# Patient Record
Sex: Female | Born: 1950 | Race: White | Hispanic: No | Marital: Married | Smoking: Never smoker
Health system: Southern US, Community
[De-identification: ages and names within clinical notes are randomized; demographics above are authoritative.]

## PROBLEM LIST (undated history)

## (undated) DIAGNOSIS — Z9889 Other specified postprocedural states: Secondary | ICD-10-CM

## (undated) DIAGNOSIS — R112 Nausea with vomiting, unspecified: Secondary | ICD-10-CM

## (undated) DIAGNOSIS — R0600 Dyspnea, unspecified: Secondary | ICD-10-CM

## (undated) DIAGNOSIS — M1990 Unspecified osteoarthritis, unspecified site: Secondary | ICD-10-CM

## (undated) DIAGNOSIS — E785 Hyperlipidemia, unspecified: Secondary | ICD-10-CM

## (undated) DIAGNOSIS — K219 Gastro-esophageal reflux disease without esophagitis: Secondary | ICD-10-CM

## (undated) DIAGNOSIS — I10 Essential (primary) hypertension: Secondary | ICD-10-CM

## (undated) DIAGNOSIS — F411 Generalized anxiety disorder: Secondary | ICD-10-CM

## (undated) DIAGNOSIS — F32A Depression, unspecified: Secondary | ICD-10-CM

## (undated) HISTORY — PX: CHOLECYSTECTOMY: SHX55

---

## 2005-06-24 ENCOUNTER — Ambulatory Visit: Payer: Self-pay

## 2019-08-26 ENCOUNTER — Other Ambulatory Visit: Payer: Self-pay

## 2019-08-26 ENCOUNTER — Encounter: Payer: Self-pay | Admitting: Emergency Medicine

## 2019-08-26 ENCOUNTER — Ambulatory Visit
Admission: EM | Admit: 2019-08-26 | Discharge: 2019-08-26 | Disposition: A | Discharge status: Home / Self Care | Payer: Medicare PPO | Attending: Family Medicine | Admitting: Family Medicine

## 2019-08-26 DIAGNOSIS — J069 Acute upper respiratory infection, unspecified: Secondary | ICD-10-CM | POA: Diagnosis not present

## 2019-08-26 DIAGNOSIS — R05 Cough: Secondary | ICD-10-CM | POA: Insufficient documentation

## 2019-08-26 DIAGNOSIS — Z79899 Other long term (current) drug therapy: Secondary | ICD-10-CM | POA: Insufficient documentation

## 2019-08-26 DIAGNOSIS — Z20822 Contact with and (suspected) exposure to covid-19: Secondary | ICD-10-CM | POA: Insufficient documentation

## 2019-08-26 DIAGNOSIS — I10 Essential (primary) hypertension: Secondary | ICD-10-CM | POA: Diagnosis not present

## 2019-08-26 DIAGNOSIS — Z7901 Long term (current) use of anticoagulants: Secondary | ICD-10-CM | POA: Insufficient documentation

## 2019-08-26 HISTORY — DX: Gastro-esophageal reflux disease without esophagitis: K21.9

## 2019-08-26 HISTORY — DX: Essential (primary) hypertension: I10

## 2019-08-26 MED ORDER — HYDROCOD POLST-CPM POLST ER 10-8 MG/5ML PO SUER: 5.0000 mL | Freq: Every evening | ORAL | 0 refills | Status: DC | PRN

## 2019-08-26 MED ORDER — BENZONATATE 200 MG PO CAPS: 200.0000 mg | ORAL_CAPSULE | Freq: Three times a day (TID) | ORAL | 0 refills | Status: DC | PRN

## 2019-08-26 NOTE — ED Provider Notes (Signed)
[facility]    CSN: [id number] Arrival date & time: 08/26/19  0815      History   Chief Complaint Chief Complaint  Patient presents with  . Cough    HPI Kristen Riley is a 69 y.o. female.   69 yo female with a c/o cough, congestion, runny nose, body aches for the past 4 days (since Thursday). Denies any fevers, chest pains, shortness of breath. Positive sick contacts with grandsons with viral URIs. Patient has not had the covid vaccines.    Cough   Past Medical History:  Diagnosis Date  . GERD (gastroesophageal reflux disease)   . Hypertension     There are no problems to display for this patient.   Past Surgical History:  Procedure Laterality Date  . CHOLECYSTECTOMY      OB History   No obstetric history on file.      Home Medications    Prior to Admission medications   Medication Sig Start Date End Date Taking? Authorizing Provider  amLODipine (NORVASC) 2.5 MG tablet Take 2.5 mg by mouth daily. 08/25/19  Yes [provider]  Cholecalciferol 25 MCG (1000 UT) tablet Take by mouth. 03/10/19 03/09/20 Yes [provider]  pantoprazole (PROTONIX) 20 MG tablet ok 06/20/19  Yes [provider]  pravastatin (PRAVACHOL) 40 MG tablet Take 40 mg by mouth at bedtime. 06/20/19  Yes [provider]  venlafaxine XR (EFFEXOR-XR) 75 MG 24 hr capsule Take by mouth. 06/20/19  Yes [provider]  benzonatate (TESSALON) 200 MG capsule Take 1 capsule (200 mg total) by mouth 3 (three) times daily as needed. 08/26/19   Payton Mccallum, MD  chlorpheniramine-HYDROcodone (TUSSIONEX PENNKINETIC ER) 10-8 MG/5ML SUER Take 5 mLs by mouth at bedtime as needed. 08/26/19   Payton Mccallum, MD    Family History History reviewed. No pertinent family history.  Social History Social History   Tobacco Use  . Smoking status: Never Smoker  . Smokeless tobacco: Never Used  Vaping Use  . Vaping Use: Never used  Substance Use Topics  . Alcohol  use: Not Currently  . Drug use: Never     Allergies   Other   Review of Systems Review of Systems  Respiratory: Positive for cough.      Physical Exam Triage Vital Signs ED Triage Vitals  Enc Vitals Group     BP 08/26/19 0832 (!) 142/86     Pulse Rate 08/26/19 0832 (!) 103     Resp 08/26/19 0832 14     Temp 08/26/19 0832 98.3 F (36.8 C)     Temp Source 08/26/19 0832 Oral     SpO2 08/26/19 0832 100 %     Weight 08/26/19 0828 178 lb (80.7 kg)     Height 08/26/19 0828 5' (1.524 m)     Head Circumference --      Peak Flow --      Pain Score 08/26/19 0827 7     Pain Loc --      Pain Edu? --      Excl. in GC? --    No data found.  Updated Vital Signs BP (!) 142/86 (BP Location: Right Arm)   Pulse (!) 103   Temp 98.3 F (36.8 C) (Oral)   Resp 14   Ht 5' (1.524 m)   Wt 80.7 kg   SpO2 100%   BMI 34.76 kg/m   Visual Acuity Right Eye Distance:   Left Eye Distance:   Bilateral Distance:  Right Eye Near:   Left Eye Near:    Bilateral Near:     Physical Exam Vitals and nursing note reviewed.  Constitutional:      General: She is not in acute distress.    Appearance: She is not toxic-appearing or diaphoretic.  HENT:     Right Ear: Tympanic membrane normal.     Left Ear: Tympanic membrane normal.     Nose: Congestion and rhinorrhea present.     Mouth/Throat:     Pharynx: No oropharyngeal exudate or posterior oropharyngeal erythema.  Cardiovascular:     Rate and Rhythm: Normal rate.  Pulmonary:     Effort: Pulmonary effort is normal. No respiratory distress.     Breath sounds: Normal breath sounds.  Musculoskeletal:     Cervical back: Neck supple.  Neurological:     Mental Status: She is alert.      UC Treatments / Results  Labs (all labs ordered are listed, but only abnormal results are displayed) Labs Reviewed  SARS CORONAVIRUS 2 (TAT 6-24 HRS)    EKG   Radiology No results found.  Procedures Procedures (including critical care  time)  Medications Ordered in UC Medications - No data to display  Initial Impression / Assessment and Plan / UC Course  I have reviewed the triage vital signs and the nursing notes.  Pertinent labs & imaging results that were available during my care of the patient were reviewed by me and considered in my medical decision making (see chart for details).      Final Clinical Impressions(s) / UC Diagnoses   Final diagnoses:  Viral URI with cough    ED Prescriptions    Medication Sig Dispense Auth. Provider   benzonatate (TESSALON) 200 MG capsule Take 1 capsule (200 mg total) by mouth 3 (three) times daily as needed. 30 capsule Payton Mccallum, MD   chlorpheniramine-HYDROcodone (TUSSIONEX PENNKINETIC ER) 10-8 MG/5ML SUER Take 5 mLs by mouth at bedtime as needed. 60 mL Payton Mccallum, MD      1. diagnosis reviewed with patient 2. rx as per orders above; reviewed possible side effects, interactions, risks and benefits  3. Recommend supportive treatment with rest, fluids 4. Follow-up prn if symptoms worsen or don't improve  I have reviewed the PDMP during this encounter.   Payton Mccallum, MD 08/26/19 [phone]

## 2019-08-26 NOTE — ED Triage Notes (Signed)
Patient c/o cough, chest congestion, bodyaches, and HAs that started on Thursday. Patient denies fevers.

## 2019-08-27 LAB — SARS CORONAVIRUS 2 (TAT 6-24 HRS): SARS Coronavirus 2: NEGATIVE

## 2020-10-26 ENCOUNTER — Ambulatory Visit
Admission: EM | Admit: 2020-10-26 | Discharge: 2020-10-26 | Disposition: A | Discharge status: Home / Self Care | Payer: Medicare PPO | Attending: Physician Assistant | Admitting: Physician Assistant

## 2020-10-26 ENCOUNTER — Encounter: Payer: Self-pay | Admitting: Emergency Medicine

## 2020-10-26 ENCOUNTER — Other Ambulatory Visit: Payer: Self-pay

## 2020-10-26 DIAGNOSIS — U071 COVID-19: Secondary | ICD-10-CM | POA: Diagnosis present

## 2020-10-26 DIAGNOSIS — R5383 Other fatigue: Secondary | ICD-10-CM | POA: Diagnosis present

## 2020-10-26 DIAGNOSIS — J029 Acute pharyngitis, unspecified: Secondary | ICD-10-CM | POA: Diagnosis present

## 2020-10-26 LAB — POCT RAPID STREP A: Streptococcus, Group A Screen (Direct): NEGATIVE

## 2020-10-26 MED ORDER — LIDOCAINE VISCOUS HCL 2 % MT SOLN: 15.0000 mL | OROMUCOSAL | 0 refills | Status: AC | PRN

## 2020-10-26 NOTE — ED Triage Notes (Signed)
Pt states she tested her self at home about 5 days ago and was positive for covid. She states she is feeling better. She still has some body aches and a sore throat. Denies fever.

## 2020-10-26 NOTE — Discharge Instructions (Addendum)
-  The strep test is negative.  Your sore throat is due to COVID-19 infection.  It should resolve in the next 3 to 5 days.  Purchase over-the-counter Chloraseptic spray and use throat lozenges as well as salt water gargles.  I have also sent in viscous lidocaine. -You need to isolate the rest of today and starting tomorrow you need to wear a mask for 5 more days according to CDC guidelines. -Increase rest and fluids. -If you develop a fever, severe cough, chest pain, breathing difficulty, weakness you should be seen again immediately.  For any severe acute worsening symptoms please go to emergency department.

## 2020-10-26 NOTE — ED Provider Notes (Signed)
[facility]    CSN: [id number] Arrival date & time: 10/26/20  0816      History   Chief Complaint Chief Complaint  Patient presents with   Covid Positive    HPI Kristen Riley is a 70 y.o. female presenting for 5-day history of nasal congestion, fatigue, aches and sore throat.  Patient did have a positive at home COVID test 5 days ago.  Her symptoms started the night before.  She has not received any COVID-19 vaccines.  Patient has been taking Tylenol and ibuprofen for body aches.  She says she feels on average 70% better than she did at the start of her symptoms.  She is just concerned about the possibility of strep throat since her throat is still sore.  All other symptoms have improved and nearly resolved.  She denies any breathing difficulty, weakness, chest pain, abdominal pain, vomiting or diarrhea.  She has a history of hypertension and is otherwise healthy.  She has no other complaints.  HPI  Past Medical History:  Diagnosis Date   GERD (gastroesophageal reflux disease)    Hypertension     There are no problems to display for this patient.   Past Surgical History:  Procedure Laterality Date   CHOLECYSTECTOMY      OB History   No obstetric history on file.      Home Medications    Prior to Admission medications   Medication Sig Start Date End Date Taking? Authorizing Provider  amLODipine (NORVASC) 2.5 MG tablet Take 2.5 mg by mouth daily. 08/25/19  Yes [provider]  lidocaine (XYLOCAINE) 2 % solution Use as directed 15 mLs in the mouth or throat every 3 (three) hours as needed for mouth pain (swish and spit). 10/26/20  Yes Eusebio Friendly B, PA-C  pantoprazole (PROTONIX) 20 MG tablet ok 06/20/19  Yes [provider]  venlafaxine XR (EFFEXOR-XR) 75 MG 24 hr capsule Take by mouth. 06/20/19  Yes [provider]  pravastatin (PRAVACHOL) 40 MG tablet Take 40 mg by mouth at bedtime. 06/20/19   [provider]    Family  History History reviewed. No pertinent family history.  Social History Social History   Tobacco Use   Smoking status: Never   Smokeless tobacco: Never  Vaping Use   Vaping Use: Never used  Substance Use Topics   Alcohol use: Not Currently   Drug use: Never     Allergies   Other   Review of Systems Review of Systems  Constitutional:  Positive for fatigue. Negative for chills, diaphoresis and fever.  HENT:  Positive for congestion, rhinorrhea and sore throat. Negative for ear pain, sinus pressure and sinus pain.   Respiratory:  Negative for cough and shortness of breath.   Gastrointestinal:  Negative for abdominal pain, nausea and vomiting.  Musculoskeletal:  Positive for myalgias. Negative for arthralgias.  Skin:  Negative for rash.  Neurological:  Negative for weakness and headaches.  Hematological:  Negative for adenopathy.    Physical Exam Triage Vital Signs ED Triage Vitals  Enc Vitals Group     BP 10/26/20 0838 140/81     Pulse Rate 10/26/20 0838 86     Resp 10/26/20 0838 18     Temp 10/26/20 0838 98 F (36.7 C)     Temp Source 10/26/20 0838 Oral     SpO2 10/26/20 0838 98 %     Weight 10/26/20 0836 177 lb 14.6 oz (80.7 kg)     Height 10/26/20  [phone] 5' (1.524 m)     Head Circumference --      Peak Flow --      Pain Score 10/26/20 0836 6     Pain Loc --      Pain Edu? --      Excl. in GC? --    No data found.  Updated Vital Signs BP 140/81 (BP Location: Left Arm)   Pulse 86   Temp 98 F (36.7 C) (Oral)   Resp 18   Ht 5' (1.524 m)   Wt 177 lb 14.6 oz (80.7 kg)   SpO2 98%   BMI 34.75 kg/m      Physical Exam Vitals and nursing note reviewed.  Constitutional:      General: She is not in acute distress.    Appearance: Normal appearance. She is not ill-appearing or toxic-appearing.  HENT:     Head: Normocephalic and atraumatic.     Nose: Congestion present.     Mouth/Throat:     Mouth: Mucous membranes are moist.     Pharynx: Oropharynx is  clear. Posterior oropharyngeal erythema present.  Eyes:     General: No scleral icterus.       Right eye: No discharge.        Left eye: No discharge.     Conjunctiva/sclera: Conjunctivae normal.  Cardiovascular:     Rate and Rhythm: Normal rate and regular rhythm.     Heart sounds: Normal heart sounds.  Pulmonary:     Effort: Pulmonary effort is normal. No respiratory distress.     Breath sounds: Normal breath sounds.  Musculoskeletal:     Cervical back: Neck supple.  Skin:    General: Skin is dry.  Neurological:     General: No focal deficit present.     Mental Status: She is alert. Mental status is at baseline.     Motor: No weakness.     Gait: Gait normal.  Psychiatric:        Mood and Affect: Mood normal.        Behavior: Behavior normal.        Thought Content: Thought content normal.     UC Treatments / Results  Labs (all labs ordered are listed, but only abnormal results are displayed) Labs Reviewed  CULTURE, GROUP A STREP [facility])  POCT RAPID STREP A    EKG   Radiology No results found.  Procedures Procedures (including critical care time)  Medications Ordered in UC Medications - No data to display  Initial Impression / Assessment and Plan / UC Course  I have reviewed the triage vital signs and the nursing notes.  Pertinent labs & imaging results that were available during my care of the patient were reviewed by me and considered in my medical decision making (see chart for details).  70 year old female presenting for 5-day history of sore throat, congestion, fatigue and achiness.  Patient did test positive for COVID-19 with an at home test 5 days ago and yesterday.  Patient reportedly feeling on average 70% better from onset of symptoms.  Vitals are all normal and stable and she is overall well-appearing.  On exam she has minor nasal congestion and posterior pharyngeal erythema and injection.  Chest is clear to auscultation heart regular rate and  rhythm.  Rapid strep test negative.  Culture sent.  Reviewed current CDC guidelines, isolation protocol and ED precautions for COVID-19.  Since she had 2 positive tests she does not need to be tested again.  She is on the fifth day of symptoms and has greatly improved.  I did discuss antiviral therapy with her versus continuing to treat symptoms at home.  Since she is doing so much better she has decided to hold off on any antiviral medications and monitor symptoms and follow-up for any worsening of symptoms.  I have sent viscous lidocaine for throat and reviewed supportive care techniques.  Final Clinical Impressions(s) / UC Diagnoses   Final diagnoses:  COVID-19  Sore throat  Fatigue, unspecified type     Discharge Instructions      -The strep test is negative.  Your sore throat is due to COVID-19 infection.  It should resolve in the next 3 to 5 days.  Purchase over-the-counter Chloraseptic spray and use throat lozenges as well as salt water gargles.  I have also sent in viscous lidocaine. -You need to isolate the rest of today and starting tomorrow you need to wear a mask for 5 more days according to CDC guidelines. -Increase rest and fluids. -If you develop a fever, severe cough, chest pain, breathing difficulty, weakness you should be seen again immediately.  For any severe acute worsening symptoms please go to emergency department.     ED Prescriptions     Medication Sig Dispense Auth. Provider   lidocaine (XYLOCAINE) 2 % solution Use as directed 15 mLs in the mouth or throat every 3 (three) hours as needed for mouth pain (swish and spit). 100 mL Shirlee Latch, PA-C      PDMP not reviewed this encounter.   Shirlee Latch, PA-C 10/26/20 [phone]

## 2020-10-29 LAB — CULTURE, GROUP A STREP (THRC)

## 2021-02-23 ENCOUNTER — Emergency Department: Payer: Medicare PPO

## 2021-02-23 ENCOUNTER — Other Ambulatory Visit: Payer: Self-pay

## 2021-02-23 ENCOUNTER — Encounter: Payer: Self-pay | Admitting: Emergency Medicine

## 2021-02-23 ENCOUNTER — Observation Stay
Admission: EM | Admit: 2021-02-23 | Discharge: 2021-02-24 | Disposition: A | Discharge status: Home / Self Care | Payer: Medicare PPO | Attending: Internal Medicine | Admitting: Internal Medicine

## 2021-02-23 DIAGNOSIS — I10 Essential (primary) hypertension: Secondary | ICD-10-CM | POA: Insufficient documentation

## 2021-02-23 DIAGNOSIS — K449 Diaphragmatic hernia without obstruction or gangrene: Secondary | ICD-10-CM | POA: Insufficient documentation

## 2021-02-23 DIAGNOSIS — E278 Other specified disorders of adrenal gland: Secondary | ICD-10-CM

## 2021-02-23 DIAGNOSIS — K219 Gastro-esophageal reflux disease without esophagitis: Secondary | ICD-10-CM | POA: Diagnosis present

## 2021-02-23 DIAGNOSIS — I700 Atherosclerosis of aorta: Secondary | ICD-10-CM | POA: Diagnosis not present

## 2021-02-23 DIAGNOSIS — Z79899 Other long term (current) drug therapy: Secondary | ICD-10-CM | POA: Diagnosis not present

## 2021-02-23 DIAGNOSIS — B950 Streptococcus, group A, as the cause of diseases classified elsewhere: Secondary | ICD-10-CM

## 2021-02-23 DIAGNOSIS — A419 Sepsis, unspecified organism: Principal | ICD-10-CM | POA: Insufficient documentation

## 2021-02-23 DIAGNOSIS — J209 Acute bronchitis, unspecified: Secondary | ICD-10-CM | POA: Insufficient documentation

## 2021-02-23 DIAGNOSIS — R531 Weakness: Secondary | ICD-10-CM | POA: Diagnosis present

## 2021-02-23 DIAGNOSIS — R6510 Systemic inflammatory response syndrome (SIRS) of non-infectious origin without acute organ dysfunction: Secondary | ICD-10-CM | POA: Diagnosis not present

## 2021-02-23 DIAGNOSIS — J020 Streptococcal pharyngitis: Secondary | ICD-10-CM | POA: Diagnosis not present

## 2021-02-23 DIAGNOSIS — E785 Hyperlipidemia, unspecified: Secondary | ICD-10-CM

## 2021-02-23 DIAGNOSIS — Z20822 Contact with and (suspected) exposure to covid-19: Secondary | ICD-10-CM | POA: Diagnosis not present

## 2021-02-23 HISTORY — DX: Hyperlipidemia, unspecified: E78.5

## 2021-02-23 LAB — COMPREHENSIVE METABOLIC PANEL
ALT: 28 U/L (ref 0–44)
AST: 26 U/L (ref 15–41)
Albumin: 3.7 g/dL (ref 3.5–5.0)
Alkaline Phosphatase: 68 U/L (ref 38–126)
Anion gap: 10 (ref 5–15)
BUN: 8 mg/dL (ref 8–23)
CO2: 23 mmol/L (ref 22–32)
Calcium: 9 mg/dL (ref 8.9–10.3)
Chloride: 103 mmol/L (ref 98–111)
Creatinine, Ser: 0.64 mg/dL (ref 0.44–1.00)
GFR, Estimated: >60 mL/min (ref >60)
Glucose, Bld: 165 mg/dL — ABNORMAL HIGH (ref 70–99)
Potassium: 3.7 mmol/L (ref 3.5–5.1)
Sodium: 136 mmol/L (ref 135–145)
Total Bilirubin: 0.6 mg/dL (ref 0.3–1.2)
Total Protein: 7.1 g/dL (ref 6.5–8.1)

## 2021-02-23 LAB — TROPONIN I (HIGH SENSITIVITY)
Troponin I (High Sensitivity): 11 ng/L (ref <18)
Troponin I (High Sensitivity): 7 ng/L (ref <18)

## 2021-02-23 LAB — RESPIRATORY PANEL BY PCR

## 2021-02-23 LAB — APTT: aPTT: 31 seconds (ref 24–36)

## 2021-02-23 LAB — URINALYSIS, COMPLETE (UACMP) WITH MICROSCOPIC
Bacteria, UA: NONE SEEN
Bilirubin Urine: NEGATIVE
Glucose, UA: NEGATIVE mg/dL
Hgb urine dipstick: NEGATIVE
Ketones, ur: NEGATIVE mg/dL
Leukocytes,Ua: NEGATIVE
Nitrite: NEGATIVE
Protein, ur: NEGATIVE mg/dL
Specific Gravity, Urine: >1.03 — ABNORMAL HIGH (ref 1.005–1.030)
pH: 5 (ref 5.0–8.0)

## 2021-02-23 LAB — GROUP A STREP BY PCR: Group A Strep by PCR: DETECTED — AB

## 2021-02-23 LAB — CBC WITH DIFFERENTIAL/PLATELET
Abs Immature Granulocytes: 0.13 10*3/uL — ABNORMAL HIGH (ref 0.00–0.07)
Basophils Absolute: 0.1 10*3/uL (ref 0.0–0.1)
Basophils Relative: 0 %
Eosinophils Absolute: 0 10*3/uL (ref 0.0–0.5)
Eosinophils Relative: 0 %
HCT: 41.9 % (ref 36.0–46.0)
Hemoglobin: 13.8 g/dL (ref 12.0–15.0)
Immature Granulocytes: 1 %
Lymphocytes Relative: 8 %
Lymphs Abs: 1.5 10*3/uL (ref 0.7–4.0)
MCH: 30.4 pg (ref 26.0–34.0)
MCHC: 32.9 g/dL (ref 30.0–36.0)
MCV: 92.3 fL (ref 80.0–100.0)
Monocytes Absolute: 0.9 10*3/uL (ref 0.1–1.0)
Monocytes Relative: 5 %
Neutro Abs: 15.3 10*3/uL — ABNORMAL HIGH (ref 1.7–7.7)
Neutrophils Relative %: 86 %
Platelets: 374 10*3/uL (ref 150–400)
RBC: 4.54 MIL/uL (ref 3.87–5.11)
RDW: 12.2 % (ref 11.5–15.5)
WBC: 17.8 10*3/uL — ABNORMAL HIGH (ref 4.0–10.5)
nRBC: 0 % (ref 0.0–0.2)

## 2021-02-23 LAB — PROTIME-INR
INR: 1 (ref 0.8–1.2)
Prothrombin Time: 13.2 seconds (ref 11.4–15.2)

## 2021-02-23 LAB — RESP PANEL BY RT-PCR (FLU A&B, COVID) ARPGX2
Influenza A by PCR: NEGATIVE
Influenza B by PCR: NEGATIVE
SARS Coronavirus 2 by RT PCR: NEGATIVE

## 2021-02-23 LAB — TSH: TSH: 0.896 u[IU]/mL (ref 0.350–4.500)

## 2021-02-23 LAB — STREP PNEUMONIAE URINARY ANTIGEN: Strep Pneumo Urinary Antigen: NEGATIVE

## 2021-02-23 LAB — PROCALCITONIN: Procalcitonin: <0.1 ng/mL

## 2021-02-23 LAB — LACTIC ACID, PLASMA
Lactic Acid, Venous: 1.5 mmol/L (ref 0.5–1.9)
Lactic Acid, Venous: 1.1 mmol/L (ref 0.5–1.9)

## 2021-02-23 MED ORDER — PANTOPRAZOLE SODIUM 20 MG PO TBEC
20.0000 mg | DELAYED_RELEASE_TABLET | Freq: Every day | ORAL | Status: DC
  Administered 2021-02-23 – 2021-02-24 (×2): 20 mg via ORAL
  Filled 2021-02-23 (×2): qty 1

## 2021-02-23 MED ORDER — LACTATED RINGERS IV SOLN: INTRAVENOUS | Status: DC

## 2021-02-23 MED ORDER — SODIUM CHLORIDE 0.9 % IV SOLN
2.0000 g | Freq: Once | INTRAVENOUS | Status: AC
  Administered 2021-02-23: 2 g via INTRAVENOUS
  Filled 2021-02-23: qty 2

## 2021-02-23 MED ORDER — METRONIDAZOLE 500 MG/100ML IV SOLN
500.0000 mg | Freq: Once | INTRAVENOUS | Status: AC
  Administered 2021-02-23: 500 mg via INTRAVENOUS
  Filled 2021-02-23: qty 100

## 2021-02-23 MED ORDER — PRAVASTATIN SODIUM 20 MG PO TABS
40.0000 mg | ORAL_TABLET | Freq: Every day | ORAL | Status: DC
  Administered 2021-02-23: 40 mg via ORAL
  Filled 2021-02-23: qty 2

## 2021-02-23 MED ORDER — IOHEXOL 350 MG/ML SOLN
100.0000 mL | Freq: Once | INTRAVENOUS | Status: AC | PRN
  Administered 2021-02-23: 100 mL via INTRAVENOUS

## 2021-02-23 MED ORDER — ONDANSETRON HCL 4 MG/2ML IJ SOLN: 4.0000 mg | Freq: Three times a day (TID) | INTRAMUSCULAR | Status: DC | PRN

## 2021-02-23 MED ORDER — LACTATED RINGERS IV BOLUS
1000.0000 mL | Freq: Once | INTRAVENOUS | Status: AC
  Administered 2021-02-23: 1000 mL via INTRAVENOUS

## 2021-02-23 MED ORDER — ACETAMINOPHEN 325 MG PO TABS
650.0000 mg | ORAL_TABLET | Freq: Four times a day (QID) | ORAL | Status: DC | PRN
  Administered 2021-02-23 – 2021-02-24 (×3): 650 mg via ORAL
  Filled 2021-02-23 (×3): qty 2

## 2021-02-23 MED ORDER — DM-GUAIFENESIN ER 30-600 MG PO TB12
1.0000 | ORAL_TABLET | Freq: Two times a day (BID) | ORAL | Status: DC | PRN
  Filled 2021-02-23: qty 1

## 2021-02-23 MED ORDER — ALBUTEROL SULFATE (2.5 MG/3ML) 0.083% IN NEBU: 2.5000 mg | INHALATION_SOLUTION | RESPIRATORY_TRACT | Status: DC | PRN

## 2021-02-23 MED ORDER — SODIUM CHLORIDE 0.9 % IV SOLN
1.0000 g | INTRAVENOUS | Status: DC
  Filled 2021-02-23: qty 10

## 2021-02-23 MED ORDER — SODIUM CHLORIDE 0.9 % IV SOLN: 1.0000 g | INTRAVENOUS | Status: DC

## 2021-02-23 MED ORDER — SODIUM CHLORIDE 0.9 % IV SOLN
500.0000 mg | INTRAVENOUS | Status: DC
  Administered 2021-02-23: 500 mg via INTRAVENOUS
  Filled 2021-02-23: qty 5

## 2021-02-23 MED ORDER — ENOXAPARIN SODIUM 40 MG/0.4ML IJ SOSY
40.0000 mg | PREFILLED_SYRINGE | INTRAMUSCULAR | Status: DC
  Administered 2021-02-23: 40 mg via SUBCUTANEOUS
  Filled 2021-02-23: qty 0.4

## 2021-02-23 MED ORDER — VENLAFAXINE HCL ER 75 MG PO CP24
75.0000 mg | ORAL_CAPSULE | Freq: Every day | ORAL | Status: DC
  Filled 2021-02-23: qty 1

## 2021-02-23 MED ORDER — ONDANSETRON HCL 4 MG/2ML IJ SOLN
4.0000 mg | Freq: Once | INTRAMUSCULAR | Status: AC
  Administered 2021-02-23: 4 mg via INTRAVENOUS
  Filled 2021-02-23: qty 2

## 2021-02-23 MED ORDER — ACETAMINOPHEN 500 MG PO TABS
1000.0000 mg | ORAL_TABLET | Freq: Once | ORAL | Status: AC
  Administered 2021-02-23: 1000 mg via ORAL
  Filled 2021-02-23: qty 2

## 2021-02-23 MED ORDER — VANCOMYCIN HCL IN DEXTROSE 1-5 GM/200ML-% IV SOLN
1000.0000 mg | Freq: Once | INTRAVENOUS | Status: DC
  Filled 2021-02-23: qty 200

## 2021-02-23 MED ORDER — PHENOL 1.4 % MT LIQD
1.0000 | OROMUCOSAL | Status: DC | PRN
  Filled 2021-02-23: qty 177

## 2021-02-23 MED ORDER — HYDRALAZINE HCL 20 MG/ML IJ SOLN: 5.0000 mg | INTRAMUSCULAR | Status: DC | PRN

## 2021-02-23 MED ORDER — DICLOFENAC SODIUM 75 MG PO TBEC
75.0000 mg | DELAYED_RELEASE_TABLET | Freq: Every day | ORAL | Status: DC
  Administered 2021-02-23 – 2021-02-24 (×2): 75 mg via ORAL
  Filled 2021-02-23 (×2): qty 1

## 2021-02-23 NOTE — ED Notes (Signed)
First nurse note: pt from home BIB EMS for generalized weakness. Pt's BP was originally 70/40s, last one with EMS was 90/60s. Denies any sick contacts.

## 2021-02-23 NOTE — Progress Notes (Signed)
CODE SEPSIS - PHARMACY COMMUNICATION  **Broad Spectrum Antibiotics should be administered within 1 hour of Sepsis diagnosis**  Time Code Sepsis Called/Page Received: 1751  Antibiotics Ordered: cefepime/metronidazole/vancomycin  Time of 1st antibiotic administration: 0735   Pricilla Riffle, PharmD, BCPS Clinical Pharmacist 02/23/2021 8:22 AM

## 2021-02-23 NOTE — ED Notes (Signed)
Unhooked patient to go to the restroom. Patient has been hooked back up and is resting in bed.

## 2021-02-23 NOTE — H&P (Signed)
History and Physical    Kristen Riley WHQ:[id number] DOB: [birth year] DOA: 02/23/2021  Referring MD/NP/PA:   PCP: Butler Denmark, MD   Patient coming from:  The patient is coming from home.    Chief Complaint: Fever, chills, cough, sore throat, nausea, vomiting, body aches, weakness  HPI: Kristen Riley is a 71 y.o. female with medical history significant of hypertension, hyperlipidemia, GERD, who presents with fever, chills, cough, sore throat, nausea, vomiting, body aches, weakness.  Patient states that he has been sick and not feeling well since yesterday.  She has fever, chills, body aches, sore throat, dry cough, generalized weakness.  Patient also has nausea and several episodes of nonbilious nonbloody vomiting.  No abdominal pain or diarrhea.  Denies shortness breath or chest pain.  No symptoms of UTI.  ED Course: pt was found to have positive rapid Strep A, negative COVID PCR, negative RVP, WBC 17.8, lactic acid 1.5, INR 1.0, PTT 31, troponin level 11, 7, negative urinalysis, electrolytes renal function okay, temperature 100.7, blood pressure 126/61, heart rate 117, RR 24, oxygen saturation 90-97% on room air.  Chest x-ray negative for infiltration.  CT angiogram is negative for PE.  CT abdomen/pelvis is negative for acute issues, but showed 1.7 cm of right adrenal nodule.  Patient is admitted to MedSurg bed as inpatient.  CT-abd/pelvis 1. No acute findings within the abdomen or pelvis. 2. Sigmoid diverticulosis without signs of acute diverticulitis. 3. Small hiatal hernia. 4. Indeterminate right adrenal nodule measuring 1.7 cm. In the absence of known malignancy this is most likely a benign adenoma. A follow-up adrenal protocol CT in 12 months may be considered. 5. Aortic Atherosclerosis (ICD10-I70.0).    EKG: I have personally reviewed.  Sinus rhythm, QTC 420, low voltage, no ischemic change  Review of Systems:   General: Has fevers, chills, no body weight gain, has poor  appetite, has fatigue HEENT: no blurry vision, hearing changes, has sore throat Respiratory: No dyspnea, has coughing, no wheezing CV: no chest pain, no palpitations GI: Has nausea, vomiting, no abdominal pain, diarrhea, constipation GU: no dysuria, burning on urination, increased urinary frequency, hematuria  Ext: no leg edema Neuro: no unilateral weakness, numbness, or tingling, no vision change or hearing loss Skin: no rash, no skin tear. MSK: No muscle spasm, no deformity, no limitation of range of movement in spin Heme: No easy bruising.  Travel history: No recent long distant travel.   Allergy:  Allergies  Allergen Reactions   Other     Myalgias   Statins     Past Medical History:  Diagnosis Date   GERD (gastroesophageal reflux disease)    HLD (hyperlipidemia)    Hypertension     Past Surgical History:  Procedure Laterality Date   CHOLECYSTECTOMY      Social History:  reports that she has never smoked. She has never used smokeless tobacco. She reports that she does not currently use alcohol. She reports that she does not use drugs.  Family History:  Family History  Problem Relation Age of Onset   Diabetes Brother      Prior to Admission medications   Medication Sig Start Date End Date Taking? Authorizing Provider  amLODipine (NORVASC) 2.5 MG tablet Take 2.5 mg by mouth daily. 08/25/19   [provider]  lidocaine (XYLOCAINE) 2 % solution Use as directed 15 mLs in the mouth or throat every 3 (three) hours as needed for mouth pain (swish and spit). 10/26/20   Eusebio Friendly  B, PA-C  pantoprazole (PROTONIX) 20 MG tablet ok 06/20/19   [provider]  pravastatin (PRAVACHOL) 40 MG tablet Take 40 mg by mouth at bedtime. 06/20/19   [provider]  venlafaxine XR (EFFEXOR-XR) 75 MG 24 hr capsule Take by mouth. 06/20/19   [provider]    Physical Exam: Vitals:   02/23/21 1430 02/23/21 1515 02/23/21 1600 02/23/21 1600  BP: 110/70  134/66    Pulse: 85 85 85   Resp: 14 (!) 22 17   Temp:    98.5 F (36.9 C)  TempSrc:      SpO2: 94% 100% 100%   Weight:      Height:       General: Not in acute distress HEENT:       Eyes: PERRL, EOMI, no scleral icterus.       ENT: No discharge from the ears and nose, has pharynx injection       Neck: No JVD, no bruit, no mass felt. Heme: No neck lymph node enlargement. Cardiac: S1/S2, RRR, No murmurs, No gallops or rubs. Respiratory: has coarse breathing sound bilaterally GI: Soft, nondistended, nontender, no rebound pain, no organomegaly, BS present. GU: No hematuria Ext: No pitting leg edema bilaterally. 1+DP/PT pulse bilaterally. Musculoskeletal: No joint deformities, No joint redness or warmth, no limitation of ROM in spin. Skin: No rashes.  Neuro: Alert, oriented X3, cranial nerves II-XII grossly intact, moves all extremities normally.  Psych: Patient is not psychotic, no suicidal or hemocidal ideation.  Labs on Admission: I have personally reviewed following labs and imaging studies  CBC: Recent Labs  Lab 02/23/21 0311  WBC 17.8*  NEUTROABS 15.3*  HGB 13.8  HCT 41.9  MCV 92.3  PLT [id number]   Basic Metabolic Panel: Recent Labs  Lab 02/23/21 0311  NA 136  K 3.7  CL 103  CO2 23  GLUCOSE 165*  BUN 8  CREATININE 0.64  CALCIUM 9.0   GFR: Estimated Creatinine Clearance: 59.8 mL/min (by C-G formula based on SCr of 0.64 mg/dL). Liver Function Tests: Recent Labs  Lab 02/23/21 0311  AST 26  ALT 28  ALKPHOS 68  BILITOT 0.6  PROT 7.1  ALBUMIN 3.7   No results for input(s): LIPASE, AMYLASE in the last 168 hours. No results for input(s): AMMONIA in the last 168 hours. Coagulation Profile: Recent Labs  Lab 02/23/21 0311  INR 1.0   Cardiac Enzymes: No results for input(s): CKTOTAL, CKMB, CKMBINDEX, TROPONINI in the last 168 hours. BNP (last 3 results) No results for input(s): PROBNP in the last 8760 hours. HbA1C: No results for input(s): HGBA1C in the  last 72 hours. CBG: No results for input(s): GLUCAP in the last 168 hours. Lipid Profile: No results for input(s): CHOL, HDL, LDLCALC, TRIG, CHOLHDL, LDLDIRECT in the last 72 hours. Thyroid Function Tests: Recent Labs    02/23/21 0516  TSH 0.896   Anemia Panel: No results for input(s): VITAMINB12, FOLATE, FERRITIN, TIBC, IRON, RETICCTPCT in the last 72 hours. Urine analysis:    Component Value Date/Time   COLORURINE YELLOW 02/23/2021 0516   APPEARANCEUR CLEAR 02/23/2021 0516   LABSPEC >1.030 (H) 02/23/2021 0516   PHURINE 5.0 02/23/2021 0516   GLUCOSEU NEGATIVE 02/23/2021 0516   HGBUR NEGATIVE 02/23/2021 0516   BILIRUBINUR NEGATIVE 02/23/2021 0516   KETONESUR NEGATIVE 02/23/2021 0516   PROTEINUR NEGATIVE 02/23/2021 0516   NITRITE NEGATIVE 02/23/2021 0516   LEUKOCYTESUR NEGATIVE 02/23/2021 0516   Sepsis Labs: @LABRCNTIP (procalcitonin:4,lacticidven:4) ) Recent Results (from the past  240 hour(s))  Resp Panel by RT-PCR (Flu A&B, Covid) Nasopharyngeal Swab     Status: None   Collection Time: 02/23/21 12:44 AM   Specimen: Nasopharyngeal Swab; Nasopharyngeal(NP) swabs in vial transport medium  Result Value Ref Range Status   SARS Coronavirus 2 by RT PCR NEGATIVE NEGATIVE Final    Comment: (NOTE) SARS-CoV-2 target nucleic acids are NOT DETECTED.  The SARS-CoV-2 RNA is generally detectable in upper respiratory specimens during the acute phase of infection. The lowest concentration of SARS-CoV-2 viral copies this assay can detect is 138 copies/mL. A negative result does not preclude SARS-Cov-2 infection and should not be used as the sole basis for treatment or other patient management decisions. A negative result may occur with  improper specimen collection/handling, submission of specimen other than nasopharyngeal swab, presence of viral mutation(s) within the areas targeted by this assay, and inadequate number of viral copies(<138 copies/mL). A negative result must be combined  with clinical observations, patient history, and epidemiological information. The expected result is Negative.  Fact Sheet for Patients:  EntrepreneurPulse.com.au  Fact Sheet for Healthcare Providers:  IncredibleEmployment.be  This test is no t yet approved or cleared by the [place] FDA and  has been authorized for detection and/or diagnosis of SARS-CoV-2 by FDA under an Emergency Use Authorization (EUA). This EUA will remain  in effect (meaning this test can be used) for the duration of the COVID-19 declaration under Section 564(b)(1) of the Act, 21 U.S.C.section 360bbb-3(b)(1), unless the authorization is terminated  or revoked sooner.       Influenza A by PCR NEGATIVE NEGATIVE Final   Influenza B by PCR NEGATIVE NEGATIVE Final    Comment: (NOTE) The Xpert Xpress SARS-CoV-2/FLU/RSV plus assay is intended as an aid in the diagnosis of influenza from Nasopharyngeal swab specimens and should not be used as a sole basis for treatment. Nasal washings and aspirates are unacceptable for Xpert Xpress SARS-CoV-2/FLU/RSV testing.  Fact Sheet for Patients: EntrepreneurPulse.com.au  Fact Sheet for Healthcare Providers: IncredibleEmployment.be  This test is not yet approved or cleared by the [place] FDA and has been authorized for detection and/or diagnosis of SARS-CoV-2 by FDA under an Emergency Use Authorization (EUA). This EUA will remain in effect (meaning this test can be used) for the duration of the COVID-19 declaration under Section 564(b)(1) of the Act, 21 U.S.C. section 360bbb-3(b)(1), unless the authorization is terminated or revoked.  Performed at [facility], [place], [place] [address]   Respiratory (~20 pathogens) panel by PCR     Status: None   Collection Time: 02/23/21 12:44 AM   Specimen: Nasopharyngeal Swab; Respiratory  Result Value Ref Range Status    Adenovirus NOT DETECTED NOT DETECTED Final   Coronavirus 229E NOT DETECTED NOT DETECTED Final    Comment: (NOTE) The Coronavirus on the Respiratory Panel, DOES NOT test for the novel  Coronavirus (2019 nCoV)    Coronavirus HKU1 NOT DETECTED NOT DETECTED Final   Coronavirus NL63 NOT DETECTED NOT DETECTED Final   Coronavirus OC43 NOT DETECTED NOT DETECTED Final   Metapneumovirus NOT DETECTED NOT DETECTED Final   Rhinovirus / Enterovirus NOT DETECTED NOT DETECTED Final   Influenza A NOT DETECTED NOT DETECTED Final   Influenza B NOT DETECTED NOT DETECTED Final   Parainfluenza Virus 1 NOT DETECTED NOT DETECTED Final   Parainfluenza Virus 2 NOT DETECTED NOT DETECTED Final   Parainfluenza Virus 3 NOT DETECTED NOT DETECTED Final   Parainfluenza Virus 4 NOT DETECTED NOT DETECTED  Final   Respiratory Syncytial Virus NOT DETECTED NOT DETECTED Final   Bordetella pertussis NOT DETECTED NOT DETECTED Final   Bordetella Parapertussis NOT DETECTED NOT DETECTED Final   Chlamydophila pneumoniae NOT DETECTED NOT DETECTED Final   Mycoplasma pneumoniae NOT DETECTED NOT DETECTED Final    Comment: Performed at [facility], [place] [address] [place], [place] [address]  Culture, blood (Routine X 2) w Reflex to ID Panel     Status: None (Preliminary result)   Collection Time: 02/23/21  3:11 AM   Specimen: BLOOD  Result Value Ref Range Status   Specimen Description BLOOD BLOOD RIGHT HAND  Final   Special Requests   Final    BOTTLES DRAWN AEROBIC AND ANAEROBIC Blood Culture adequate volume   Culture   Final    NO GROWTH <12 HOURS Performed at [facility], [address], [place], [place] [address]    Report Status PENDING  Incomplete  Culture, blood (Routine X 2) w Reflex to ID Panel     Status: None (Preliminary result)   Collection Time: 02/23/21  5:16 AM   Specimen: BLOOD  Result Value Ref Range Status   Specimen Description BLOOD BLOOD RIGHT WRIST  Final   Special Requests   Final     BOTTLES DRAWN AEROBIC ONLY Blood Culture adequate volume   Culture   Final    NO GROWTH <12 HOURS Performed at [facility], [address] [place], [place] [address]    Report Status PENDING  Incomplete  Group A Strep by PCR     Status: Abnormal   Collection Time: 02/23/21 12:42 PM   Specimen: Throat; Sterile Swab  Result Value Ref Range Status   Group A Strep by PCR DETECTED (A) NOT DETECTED Final    Comment: Performed at [facility], [address] [place], [place] [address]     Radiological Exams on Admission: DG Chest 2 View  Result Date: 02/23/2021 CLINICAL DATA:  Hypoxia and weakness. EXAM: CHEST - 2 VIEW COMPARISON:  None. FINDINGS: The heart size and mediastinal contours are within normal limits. Both lungs are clear. There is mild osteopenia and degenerative change of the thoracic spine. IMPRESSION: No active cardiopulmonary disease. Electronically Signed   By: Telford Nab M.D.   On: 02/23/2021 05:11   CT Angio Chest PE W and/or Wo Contrast  Result Date: 02/23/2021 CLINICAL DATA:  Body aches and generalized weakness since this morning. EXAM: CT ANGIOGRAPHY CHEST WITH CONTRAST TECHNIQUE: Multidetector CT imaging of the chest was performed using the standard protocol during bolus administration of intravenous contrast. Multiplanar CT image reconstructions and MIPs were obtained to evaluate the vascular anatomy. RADIATION DOSE REDUCTION: This exam was performed according to the departmental dose-optimization program which includes automated exposure control, adjustment of the mA and/or kV according to patient size and/or use of iterative reconstruction technique. CONTRAST:  160mL OMNIPAQUE IOHEXOL 350 MG/ML SOLN COMPARISON:  None. FINDINGS: Cardiovascular: Satisfactory opacification of the pulmonary arteries to the segmental level. No evidence of pulmonary embolism. Normal heart size. No pericardial effusion. Mild coronary artery atherosclerotic calcifications.2  Mediastinum/Nodes: No enlarged mediastinal, hilar, or axillary lymph nodes. Thyroid gland, trachea, and esophagus demonstrate no significant findings. Lungs/Pleura: Lungs are clear. No pleural effusion or pneumothorax. Bibasilar dependent atelectasis. Upper Abdomen: No acute abnormality.  Small hiatal hernia. Musculoskeletal: No chest wall abnormality. No acute or significant osseous findings. Review of the MIP images confirms the above findings. IMPRESSION: 1.  No pulmonary embolism or acute  cardiopulmonary process. 2.   Small hiatal hernia. Electronically Signed   By: Keane Police D.O.   On: 02/23/2021 08:37   CT ABDOMEN PELVIS W CONTRAST  Result Date: 02/23/2021 CLINICAL DATA:  Sepsis.  Body aches and generalized weakness EXAM: CT ABDOMEN AND PELVIS WITH CONTRAST TECHNIQUE: Multidetector CT imaging of the abdomen and pelvis was performed using the standard protocol following bolus administration of intravenous contrast. RADIATION DOSE REDUCTION: This exam was performed according to the departmental dose-optimization program which includes automated exposure control, adjustment of the mA and/or kV according to patient size and/or use of iterative reconstruction technique. CONTRAST:  141mL OMNIPAQUE IOHEXOL 350 MG/ML SOLN COMPARISON:  None. FINDINGS: Lower chest: No acute abnormality. Hepatobiliary: No suspicious liver abnormality. Status post cholecystectomy. No bile duct dilatation. Pancreas: Unremarkable. No pancreatic ductal dilatation or surrounding inflammatory changes. Spleen: Normal in size without focal abnormality. Adrenals/Urinary Tract: Right adrenal nodule measures 1.7 cm and 54.5 Hounsfield units, image 58/6. Normal left adrenal gland. No kidney stone, mass or hydronephrosis identified bilaterally. No hydroureter or ureteral lithiasis. Urinary bladder is unremarkable. Stomach/Bowel: Small hiatal hernia. The appendix is visualized and is normal. No bowel wall thickening, inflammation, or  distension. Sigmoid diverticulosis without signs of acute diverticulitis. Vascular/Lymphatic: Aortic atherosclerosis. No aneurysm. No signs of abdominopelvic adenopathy. Reproductive: Uterus and bilateral adnexa are unremarkable. Other: No ascites or focal fluid collections. Musculoskeletal: No acute or significant osseous findings. Degenerative disc disease noted at L5-S1. IMPRESSION: 1. No acute findings within the abdomen or pelvis. 2. Sigmoid diverticulosis without signs of acute diverticulitis. 3. Small hiatal hernia. 4. Indeterminate right adrenal nodule measuring 1.7 cm. In the absence of known malignancy this is most likely a benign adenoma. A follow-up adrenal protocol CT in 12 months may be considered. 5. Aortic Atherosclerosis (ICD10-I70.0). Electronically Signed   By: Kerby Moors M.D.   On: 02/23/2021 08:33      Assessment/Plan Principal Problem:   Streptococcal pharyngitis Active Problems:   Hypertension   GERD (gastroesophageal reflux disease)   HLD (hyperlipidemia)   Sepsis (HCC)   Adrenal nodule (HCC)   Streptococcal infection group A  Sepsis due to streptococcal pharyngitis (Streptococcal infection group A) Patient has positive rapid strep group A test.  He meets criteria for sepsis with WBC 17.8, fever of 100.7, tachycardia with heart rate 117, tachypnea with RR 24.  Lactic acid is normal 1.5.  -Admitted to telemetry bed as inpatient -Ceftriaxone IV (patient received 1 dose of vancomycin Flagyl and cefepime in ED, also received 1 dose of azithromycin) -will get Procalcitonin and trend lactic acid levels per sepsis protocol. -IVF: 2L of LS bolus in ED, followed by 100cc/h  -f/u blood culture  Hypertension -Hold amlodipine since patient is at risk of developing hypotension secondary to sepsis -IV hydralazine as needed  GERD (gastroesophageal reflux disease) -Protonix  HLD (hyperlipidemia) -Pravastatin  Adrenal nodule ([place]): CT scan incidentally showed right adrenal  nodule 1.7 cm, likely due to adenoma -Follow-up with PCP and repeat CT scan in 12 months     DVT ppx: SQ Lovenox Code Status: Full code Family Communication: Yes, patient's  husband  by phone Disposition Plan:  Anticipate discharge back to previous environment Consults called:  none Admission status and Level of care: Telemetry Medical:    as inpt     Status is: Inpatient  Remains inpatient appropriate because: Patient has multiple comorbidities, now presents with sepsis due to streptococcal pharyngitis.  Her presentation is highly complicated.  Patient at high risk of deteriorating.  Need to be treated in hospital for at least 2 days           Date of Service 02/23/2021    [facility]   If 7PM-7AM, please contact night-coverage [email or web address] 02/23/2021, 4:59 PM

## 2021-02-23 NOTE — Progress Notes (Signed)
PHARMACY -  BRIEF ANTIBIOTIC NOTE   Pharmacy has received consult(s) for vancomycin and cefepime from an ED provider.  The patient's profile has been reviewed for ht/wt/allergies/indication/available labs.    One time order(s) placed for vancomycin 1 g + cefepime 2 g  Further antibiotics/pharmacy consults should be ordered by admitting physician if indicated.                       Thank you,  Pricilla Riffle, PharmD, BCPS Clinical Pharmacist 02/23/2021 7:14 AM

## 2021-02-23 NOTE — ED Provider Notes (Signed)
[facility] Provider Note    Event Date/Time   First MD Initiated Contact with Patient 02/23/21 [phone]     (approximate)   History   Weakness   HPI  Kristen Riley is a 71 y.o. female with a past medical history of anxiety, depression, reflux, and arthritis in her knees who presents for assessment of generalized achiness and weakness associate with some nonbloody nonbilious vomiting starting yesterday.  Patient states she felt fine the day before.  She denies any headache, earache, sore throat, cough, chest pain, abdominal pain, diarrhea, constipation, urinary symptoms, rash or extremity pain.  No other acute complaints.  She denies tobacco abuse, EtOH use or illicit drug use.  She notes she was recently had a preop appointment at [facility] because she supposed to get bilateral knee replacements in 2 weeks.      Physical Exam  Triage Vital Signs: ED Triage Vitals [02/23/21 0035]  Enc Vitals Group     BP (!) 163/87     Pulse Rate (!) 117     Resp 18     Temp 98.8 F (37.1 C)     Temp src      SpO2 90 %     Weight 177 lb 14.6 oz (80.7 kg)     Height 5' (1.524 m)     Head Circumference      Peak Flow      Pain Score 3     Pain Loc      Pain Edu?      Excl. in GC?     Most recent vital signs: Vitals:   02/23/21 0544 02/23/21 0600  BP:  (!) 130/97  Pulse:  (!) 103  Resp:  17  Temp: (!) 100.7 F (38.2 C)   SpO2:  95%    General: Awake, no distress.  CV:  Slightly prolonged capillary refill.  Slightly tachycardic.  No murmurs rubs or gallops. Resp:  Normal effort.  Clear bilaterally. Abd:  No distention.  Soft nondistended nontender throughout. Other:  Oropharynx is dry.  No significant edema.    ED Results / Procedures / Treatments  Labs (all labs ordered are listed, but only abnormal results are displayed) Labs Reviewed  COMPREHENSIVE METABOLIC PANEL - Abnormal; Notable for the following components:      Result Value   Glucose, Bld 165 (*)     All other components within normal limits  CBC WITH DIFFERENTIAL/PLATELET - Abnormal; Notable for the following components:   WBC 17.8 (*)    Neutro Abs 15.3 (*)    Abs Immature Granulocytes 0.13 (*)    All other components within normal limits  URINALYSIS, COMPLETE (UACMP) WITH MICROSCOPIC - Abnormal; Notable for the following components:   Specific Gravity, Urine >1.030 (*)    All other components within normal limits  RESP PANEL BY RT-PCR (FLU A&B, COVID) ARPGX2  CULTURE, BLOOD (ROUTINE X 2)  CULTURE, BLOOD (ROUTINE X 2)  URINE CULTURE  RESPIRATORY PANEL BY PCR  LACTIC ACID, PLASMA  PROCALCITONIN  PROTIME-INR  APTT  LACTIC ACID, PLASMA  TSH  TROPONIN I (HIGH SENSITIVITY)  TROPONIN I (HIGH SENSITIVITY)     EKG  EKG remarkable for sinus tachycardia with a ventricular rate of 104, normal axis, unremarkable intervals without evidence of acute ischemia or significant arrhythmia.   RADIOLOGY  Chest x-ray shows no focal consolidation, effusion, edema, pneumothorax or any other clear acute thoracic process.  This was reviewed by myself.  I also reviewed  radiology's interpretation and agree with their findings.   PROCEDURES:  Critical Care performed: Yes, see critical care procedure note(s)  .Critical Care Performed by: Gilles Chiquito, MD Authorized by: Gilles Chiquito, MD   Critical care provider statement:    Critical care time (minutes):  30   Critical care was necessary to treat or prevent imminent or life-threatening deterioration of the following conditions:  Sepsis   Critical care was time spent personally by me on the following activities:  Development of treatment plan with patient or surrogate, discussions with consultants, evaluation of patient's response to treatment, examination of patient, ordering and review of laboratory studies, ordering and review of radiographic studies, ordering and performing treatments and interventions, pulse oximetry,  re-evaluation of patient's condition and review of old charts .1-3 Lead EKG Interpretation Performed by: Gilles Chiquito, MD Authorized by: Gilles Chiquito, MD     Interpretation: non-specific     ECG rate assessment: tachycardic     Rhythm: sinus tachycardia     Ectopy: none     Conduction: normal    The patient is on the cardiac monitor to evaluate for evidence of arrhythmia and/or significant heart rate changes.   MEDICATIONS ORDERED IN ED: Medications  lactated ringers infusion (has no administration in time range)  ceFEPIme (MAXIPIME) 2 g in sodium chloride 0.9 % 100 mL IVPB (has no administration in time range)  metroNIDAZOLE (FLAGYL) IVPB 500 mg (has no administration in time range)  vancomycin (VANCOCIN) IVPB 1000 mg/200 mL premix (has no administration in time range)  lactated ringers bolus 1,000 mL (has no administration in time range)  ondansetron (ZOFRAN) injection 4 mg (4 mg Intravenous Given 02/23/21 0050)  lactated ringers bolus 1,000 mL (1,000 mLs Intravenous New Bag/Given 02/23/21 0647)  acetaminophen (TYLENOL) tablet 1,000 mg (1,000 mg Oral Given 02/23/21 3536)     IMPRESSION / MDM / ASSESSMENT AND PLAN / ED COURSE  I reviewed the triage vital signs and the nursing notes.                              Differential diagnosis includes, but is not limited to acute infectious process with low suspicion at this time for CVA, trauma, toxic ingestion or other immediate life-threatening process.  EKG remarkable for sinus tachycardia with a ventricular rate of 104, normal axis, unremarkable intervals without evidence of acute ischemia or significant arrhythmia.  Troponin nonelevated not suggestive of ACS or myocarditis.  Chest x-ray shows no focal consolidation, effusion, edema, pneumothorax or any other clear acute thoracic process.  This was reviewed by myself.  I also reviewed radiology's interpretation and agree with their findings.  COVID influenza PCR is negative.   Initial lactic nonelevated 1.5.  Procalcitonin is undetectable.  CMP with a glucose of 165 without any other significant electrolyte or metabolic derangements.  CBC shows WBC count of 17.8 without evidence of acute anemia.  Coagulation studies are unremarkable.  UA unremarkable for evidence of cystitis  On my reassessment patient is still tachycardic and tachypneic with intermittent SPO2 dropped to 91%.  Unclear at this time etiology for fever there is no obvious foci on exam chest x-ray or UA.  However she does meet multiple SIRS criteria and concern for possible early sepsis from unclear source.  We will give broad-spectrum antibiotics and obtain a CT chest abdomen pelvis as well as a full respiratory virus panel.  Patient started on IV fluids.  I  explained to patient she will likely require admission.  Care of patient taken over by Lelon MastSamantha Reiter approximately 0 700 with plan to follow-up remaining labs and CT and reassess.      FINAL CLINICAL IMPRESSION(S) / ED DIAGNOSES   Final diagnoses:  Sepsis without acute organ dysfunction, due to unspecified organism [facility](HCC)     Rx / DC Orders   ED Discharge Orders     None        Note:  This document was prepared using Dragon voice recognition software and may include unintentional dictation errors.   Gilles ChiquitoSmith, Keyston Ardolino P, MD 02/23/21 [id number]

## 2021-02-23 NOTE — ED Notes (Addendum)
This RN called lab regarding blood work not being received. Per lab technician they have the blood to do the blood work however not received by lab technician on previous shift, however will start running blood now.

## 2021-02-23 NOTE — ED Notes (Signed)
Attempting 20g IV placement for CTA.

## 2021-02-23 NOTE — ED Notes (Signed)
ED provider at bedside discussing plan of care with pt.

## 2021-02-23 NOTE — ED Provider Notes (Signed)
Patient care assumed at 7 AM.  Briefly, 71 year old female here with fever, weakness, leukocytosis, and concern for sepsis.  Source not readily identified.  Urinalysis negative.  COVID-negative.  CT of the chest, abdomen, pelvis pending for evaluation of possible source for occult sepsis.  Patient given empiric fluids and antibiotics with likely admission given her age, and lab work with generalized weakness.  CT chest/abdomen/pelvis reviewed and shows no acute normality.  Patient does have bibasilar atelectasis.  She is satting 92 to 93% and remains tachypneic and tachycardic.  Clinically, suspect possible pneumonia versus viral URI with sepsis physiology.  She remains weak.  Will plan to admit.   Shaune Pollack, MD 02/23/21 [phone]

## 2021-02-23 NOTE — Plan of Care (Signed)
°  Problem: Activity: Goal: Ability to tolerate increased activity will improve Outcome: Progressing   Problem: Clinical Measurements: Goal: Ability to maintain a body temperature in the normal range will improve Outcome: Progressing   Problem: Respiratory: Goal: Ability to maintain adequate ventilation will improve Outcome: Progressing Goal: Ability to maintain a clear airway will improve Outcome: Progressing   Problem: Education: Goal: Knowledge of General Education information will improve Description: Including pain rating scale, medication(s)/side effects and non-pharmacologic comfort measures Outcome: Progressing   Problem: Health Behavior/Discharge Planning: Goal: Ability to manage health-related needs will improve Outcome: Progressing   Problem: Activity: Goal: Risk for activity intolerance will decrease Outcome: Progressing   Problem: Nutrition: Goal: Adequate nutrition will be maintained Outcome: Progressing   Problem: Coping: Goal: Level of anxiety will decrease Outcome: Progressing   Problem: Pain Managment: Goal: General experience of comfort will improve Outcome: Progressing   Problem: Safety: Goal: Ability to remain free from injury will improve Outcome: Progressing   Problem: Skin Integrity: Goal: Risk for impaired skin integrity will decrease Outcome: Progressing

## 2021-02-23 NOTE — ED Notes (Signed)
This RN called lab at this time to draw labs.

## 2021-02-23 NOTE — Progress Notes (Signed)
Elink following code sepsis °

## 2021-02-23 NOTE — ED Notes (Signed)
20g IV obtained, will call CT.

## 2021-02-23 NOTE — ED Notes (Signed)
Will start vanc and LR infusion once flagyl complete and can use pump channel.

## 2021-02-23 NOTE — ED Triage Notes (Signed)
Pt BIB EMS from home. Pt c/o body aches and generalized weakness since this morning. Pt denies, vomiting, just dry heaves.

## 2021-02-24 DIAGNOSIS — J020 Streptococcal pharyngitis: Secondary | ICD-10-CM | POA: Diagnosis not present

## 2021-02-24 LAB — CBC
HCT: 36.7 % (ref 36.0–46.0)
Hemoglobin: 12.1 g/dL (ref 12.0–15.0)
MCH: 30.9 pg (ref 26.0–34.0)
MCHC: 33 g/dL (ref 30.0–36.0)
MCV: 93.6 fL (ref 80.0–100.0)
Platelets: 278 10*3/uL (ref 150–400)
RBC: 3.92 MIL/uL (ref 3.87–5.11)
RDW: 12.4 % (ref 11.5–15.5)
WBC: 13.5 10*3/uL — ABNORMAL HIGH (ref 4.0–10.5)
nRBC: 0 % (ref 0.0–0.2)

## 2021-02-24 LAB — LEGIONELLA PNEUMOPHILA SEROGP 1 UR AG: L. pneumophila Serogp 1 Ur Ag: NEGATIVE

## 2021-02-24 LAB — URINE CULTURE: Culture: <10000 — AB

## 2021-02-24 MED ORDER — PHENOL 1.4 % MT LIQD: 1.0000 | OROMUCOSAL | 0 refills | Status: AC | PRN

## 2021-02-24 MED ORDER — PENICILLIN V POTASSIUM 500 MG PO TABS
500.0000 mg | ORAL_TABLET | Freq: Three times a day (TID) | ORAL | Status: DC
  Administered 2021-02-24: 500 mg via ORAL
  Filled 2021-02-24 (×2): qty 1

## 2021-02-24 MED ORDER — PENICILLIN V POTASSIUM 500 MG PO TABS: 500.0000 mg | ORAL_TABLET | Freq: Three times a day (TID) | ORAL | 0 refills | Status: AC

## 2021-02-24 MED ORDER — VENLAFAXINE HCL ER 75 MG PO CP24
75.0000 mg | ORAL_CAPSULE | Freq: Every day | ORAL | Status: DC
  Administered 2021-02-24: 75 mg via ORAL
  Filled 2021-02-24: qty 1

## 2021-02-24 MED ORDER — SODIUM CHLORIDE 0.9 % IV SOLN
1.0000 g | INTRAVENOUS | Status: DC
  Filled 2021-02-24: qty 10

## 2021-02-24 NOTE — Discharge Summary (Signed)
[facility] [place] [facility]   PATIENT NAME: Kristen Riley    MR#:  [id number]  DATE OF BIRTH:  05/22/1950  DATE OF ADMISSION:  02/23/2021 ADMITTING PHYSICIAN: Lorretta HarpXilin Niu, MD  DATE OF DISCHARGE: 02/24/2021  PRIMARY CARE PHYSICIAN: Butler Denmarkaniels, Chelsea Kagan, MD    ADMISSION DIAGNOSIS:  Acute bronchitis [J20.9] SIRS (systemic inflammatory response syndrome) (HCC) [R65.10] Sepsis without acute organ dysfunction, due to unspecified organism (HCC) [A41.9]  DISCHARGE DIAGNOSIS:  sepsis without acute organ dysfunction group a strep pharyngitis SECONDARY DIAGNOSIS:   Past Medical History:  Diagnosis Date   GERD (gastroesophageal reflux disease)    HLD (hyperlipidemia)    Hypertension     HOSPITAL COURSE:  Kristen MoynahanJudy Q Riley is a 71 y.o. female with medical history significant of hypertension, hyperlipidemia, GERD, who presents with fever, chills, cough, sore throat, nausea, vomiting, body aches, weakness.  CT-abd/pelvis 1. No acute findings within the abdomen or pelvis. 2. Sigmoid diverticulosis without signs of acute diverticulitis. 3. Small hiatal hernia. 4. Indeterminate right adrenal nodule measuring 1.7 cm. In the absence of known malignancy this is most likely a benign adenoma. A follow-up adrenal protocol CT in 12 months may be considered. 5. Aortic Atherosclerosis (ICD10-I70.0).  CT chest with contrast No pulmonary embolism or acute cardiopulmonary process.  Small hiatal hernia  chest x-ray no acute cardiopulmonary disease  sepsis due to strep pharyngitis group a -- came in with white count of 17.8. Fever tachycardia and tachypnea. -- Normal lactic acid -- patient overall improved. She received broad-spectrum antibiotic and the ED. Placed on IV Rocephin. Discussed with pharmacy will change to penicillin V 500 mg TID for five days -- patient tolerating PO diet. Received IV fluids. -- Overall feels better and wants to go home. Hemodynamically stable --  sepsis improved/resolved  Hypertension -- blood pressure much stable. Resume BP meds at discharge  Adrenal nodule Kristen Riley(HCC): CT scan incidentally showed right adrenal nodule 1.7 cm, likely due to adenoma -Follow-up with PCP and repeat CT scan in 12 months  discharge to home with outpatient follow-up PCP   CONSULTS OBTAINED:    DRUG ALLERGIES:   Allergies  Allergen Reactions   Other     Myalgias   Statins     DISCHARGE MEDICATIONS:   Allergies as of 02/24/2021       Reactions   Other    Myalgias   Statins         Medication List     TAKE these medications    amLODipine 2.5 MG tablet Commonly known as: NORVASC Take 2.5 mg by mouth daily. Notes to patient: Not given in hospital   diclofenac 75 MG EC tablet Commonly known as: VOLTAREN Take 1 tablet by mouth daily.   lidocaine 2 % solution Commonly known as: XYLOCAINE Use as directed 15 mLs in the mouth or throat every 3 (three) hours as needed for mouth pain (swish and spit). Notes to patient: Not given in hospital1/18/23 tomorrow   pantoprazole 20 MG tablet Commonly known as: PROTONIX ok   penicillin v potassium 500 MG tablet Commonly known as: VEETID Take 1 tablet (500 mg total) by mouth every 8 (eight) hours for 10 days.   phenol 1.4 % Liqd Commonly known as: CHLORASEPTIC Use as directed 1 spray in the mouth or throat as needed for throat irritation / pain.   pravastatin 40 MG tablet Commonly known as: PRAVACHOL Take 40 mg by mouth at bedtime.   venlafaxine XR 75 MG 24 hr capsule  Commonly known as: EFFEXOR-XR Take by mouth. What changed: Another medication with the same name was removed. Continue taking this medication, and follow the directions you see here.        If you experience worsening of your admission symptoms, develop shortness of breath, life threatening emergency, suicidal or homicidal thoughts you must seek medical attention immediately by calling 911 or calling your MD  immediately  if symptoms less severe.  You Must read complete instructions/literature along with all the possible adverse reactions/side effects for all the Medicines you take and that have been prescribed to you. Take any new Medicines after you have completely understood and accept all the possible adverse reactions/side effects.   Please note  You were cared for by a hospitalist during your hospital stay. If you have any questions about your discharge medications or the care you received while you were in the hospital after you are discharged, you can call the unit and asked to speak with the hospitalist on call if the hospitalist that took care of you is not available. Once you are discharged, your primary care physician will handle any further medical issues. Please note that NO REFILLS for any discharge medications will be authorized once you are discharged, as it is imperative that you return to your primary care physician (or establish a relationship with a primary care physician if you do not have one) for your aftercare needs so that they can reassess your need for medications and monitor your lab values. Today   SUBJECTIVE   able to swallow. Some mild scratchy throat. No fever. Tolerating PO diet and fluid  VITAL SIGNS:  Blood pressure 127/80, pulse 84, temperature 98.2 F (36.8 C), resp. rate 16, height 5' (1.524 m), weight 80.7 kg, SpO2 94 %.  I/O:   Intake/Output Summary (Last 24 hours) at 02/24/2021 1105 Last data filed at 02/24/2021 0650 Gross per 24 hour  Intake 1491.52 ml  Output --  Net 1491.52 ml    PHYSICAL EXAMINATION:  GENERAL:  71 y.o.-year-old patient lying in the bed with no acute distress.  LUNGS: Normal breath sounds bilaterally, no wheezing, rales,rhonchi or crepitation.  CARDIOVASCULAR: S1, S2 normal. No murmurs, rubs, or gallops.  ABDOMEN: Soft, non-tender, non-distended. Bowel sounds present.  EXTREMITIES: No  clubbing.  NEUROLOGIC:  non-focal PSYCHIATRIC:  patient is alert and awake  SKIN: No obvious rash, lesion, or ulcer.   DATA REVIEW:   CBC  Recent Labs  Lab 02/24/21 0500  WBC 13.5*  HGB 12.1  HCT 36.7  PLT 278    Chemistries  Recent Labs  Lab 02/23/21 0311  NA 136  K 3.7  CL 103  CO2 23  GLUCOSE 165*  BUN 8  CREATININE 0.64  CALCIUM 9.0  AST 26  ALT 28  ALKPHOS 68  BILITOT 0.6    Microbiology Results   Recent Results (from the past 240 hour(s))  Resp Panel by RT-PCR (Flu A&B, Covid) Nasopharyngeal Swab     Status: None   Collection Time: 02/23/21 12:44 AM   Specimen: Nasopharyngeal Swab; Nasopharyngeal(NP) swabs in vial transport medium  Result Value Ref Range Status   SARS Coronavirus 2 by RT PCR NEGATIVE NEGATIVE Final    Comment: (NOTE) SARS-CoV-2 target nucleic acids are NOT DETECTED.  The SARS-CoV-2 RNA is generally detectable in upper respiratory specimens during the acute phase of infection. The lowest concentration of SARS-CoV-2 viral copies this assay can detect is 138 copies/mL. A negative result does not preclude SARS-Cov-2 infection and  should not be used as the sole basis for treatment or other patient management decisions. A negative result may occur with  improper specimen collection/handling, submission of specimen other than nasopharyngeal swab, presence of viral mutation(s) within the areas targeted by this assay, and inadequate number of viral copies(<138 copies/mL). A negative result must be combined with clinical observations, patient history, and epidemiological information. The expected result is Negative.  Fact Sheet for Patients:  BloggerCourse.comhttps://www.fda.gov/media/152166/download  Fact Sheet for Healthcare Providers:  SeriousBroker.ithttps://www.fda.gov/media/152162/download  This test is no t yet approved or cleared by the [place] FDA and  has been authorized for detection and/or diagnosis of SARS-CoV-2 by FDA under an Emergency Use Authorization (EUA). This EUA will  remain  in effect (meaning this test can be used) for the duration of the COVID-19 declaration under Section 564(b)(1) of the Act, 21 U.S.C.section 360bbb-3(b)(1), unless the authorization is terminated  or revoked sooner.       Influenza A by PCR NEGATIVE NEGATIVE Final   Influenza B by PCR NEGATIVE NEGATIVE Final    Comment: (NOTE) The Xpert Xpress SARS-CoV-2/FLU/RSV plus assay is intended as an aid in the diagnosis of influenza from Nasopharyngeal swab specimens and should not be used as a sole basis for treatment. Nasal washings and aspirates are unacceptable for Xpert Xpress SARS-CoV-2/FLU/RSV testing.  Fact Sheet for Patients: BloggerCourse.comhttps://www.fda.gov/media/152166/download  Fact Sheet for Healthcare Providers: SeriousBroker.ithttps://www.fda.gov/media/152162/download  This test is not yet approved or cleared by the [place] FDA and has been authorized for detection and/or diagnosis of SARS-CoV-2 by FDA under an Emergency Use Authorization (EUA). This EUA will remain in effect (meaning this test can be used) for the duration of the COVID-19 declaration under Section 564(b)(1) of the Act, 21 U.S.C. section 360bbb-3(b)(1), unless the authorization is terminated or revoked.  Performed at [facility], [address] [place], [place] [address]   Respiratory (~20 pathogens) panel by PCR     Status: None   Collection Time: 02/23/21 12:44 AM   Specimen: Nasopharyngeal Swab; Respiratory  Result Value Ref Range Status   Adenovirus NOT DETECTED NOT DETECTED Final   Coronavirus 229E NOT DETECTED NOT DETECTED Final    Comment: (NOTE) The Coronavirus on the Respiratory Panel, DOES NOT test for the novel  Coronavirus (2019 nCoV)    Coronavirus HKU1 NOT DETECTED NOT DETECTED Final   Coronavirus NL63 NOT DETECTED NOT DETECTED Final   Coronavirus OC43 NOT DETECTED NOT DETECTED Final   Metapneumovirus NOT DETECTED NOT DETECTED Final   Rhinovirus / Enterovirus NOT DETECTED NOT DETECTED  Final   Influenza A NOT DETECTED NOT DETECTED Final   Influenza B NOT DETECTED NOT DETECTED Final   Parainfluenza Virus 1 NOT DETECTED NOT DETECTED Final   Parainfluenza Virus 2 NOT DETECTED NOT DETECTED Final   Parainfluenza Virus 3 NOT DETECTED NOT DETECTED Final   Parainfluenza Virus 4 NOT DETECTED NOT DETECTED Final   Respiratory Syncytial Virus NOT DETECTED NOT DETECTED Final   Bordetella pertussis NOT DETECTED NOT DETECTED Final   Bordetella Parapertussis NOT DETECTED NOT DETECTED Final   Chlamydophila pneumoniae NOT DETECTED NOT DETECTED Final   Mycoplasma pneumoniae NOT DETECTED NOT DETECTED Final    Comment: Performed at [facility], [address] [place], [place] [address]  Culture, blood (Routine X 2) w Reflex to ID Panel     Status: None (Preliminary result)   Collection Time: 02/23/21  3:11 AM   Specimen: BLOOD  Result Value Ref Range Status   Specimen Description  BLOOD BLOOD RIGHT HAND  Final   Special Requests   Final    BOTTLES DRAWN AEROBIC AND ANAEROBIC Blood Culture adequate volume   Culture   Final    NO GROWTH 1 DAY Performed at [facility], [address] [place], [place] [address]    Report Status PENDING  Incomplete  Culture, blood (Routine X 2) w Reflex to ID Panel     Status: None (Preliminary result)   Collection Time: 02/23/21  5:16 AM   Specimen: BLOOD  Result Value Ref Range Status   Specimen Description BLOOD BLOOD RIGHT WRIST  Final   Special Requests   Final    BOTTLES DRAWN AEROBIC ONLY Blood Culture adequate volume   Culture   Final    NO GROWTH 1 DAY Performed at [facility], [address], [place], [place] [address]    Report Status PENDING  Incomplete  Group A Strep by PCR     Status: Abnormal   Collection Time: 02/23/21 12:42 PM   Specimen: Throat; Sterile Swab  Result Value Ref Range Status   Group A Strep by PCR DETECTED (A) NOT DETECTED Final    Comment: Performed at [facility], [address] [place], [place] [address]    RADIOLOGY:  DG Chest 2 View  Result Date: 02/23/2021 CLINICAL DATA:  Hypoxia and weakness. EXAM: CHEST - 2 VIEW COMPARISON:  None. FINDINGS: The heart size and mediastinal contours are within normal limits. Both lungs are clear. There is mild osteopenia and degenerative change of the thoracic spine. IMPRESSION: No active cardiopulmonary disease. Electronically Signed   By: Almira Bar M.D.   On: 02/23/2021 05:11   CT Angio Chest PE W and/or Wo Contrast  Result Date: 02/23/2021 CLINICAL DATA:  Body aches and generalized weakness since this morning. EXAM: CT ANGIOGRAPHY CHEST WITH CONTRAST TECHNIQUE: Multidetector CT imaging of the chest was performed using the standard protocol during bolus administration of intravenous contrast. Multiplanar CT image reconstructions and MIPs were obtained to evaluate the vascular anatomy. RADIATION DOSE REDUCTION: This exam was performed according to the departmental dose-optimization program which includes automated exposure control, adjustment of the mA and/or kV according to patient size and/or use of iterative reconstruction technique. CONTRAST:  OMNIPAQUE IOHEXOL 350 MG/ML SOLN COMPARISON:  None. FINDINGS: Cardiovascular: Satisfactory opacification of the pulmonary arteries to the segmental level. No evidence of pulmonary embolism. Normal heart size. No pericardial effusion. Mild coronary artery atherosclerotic calcifications.2 Mediastinum/Nodes: No enlarged mediastinal, hilar, or axillary lymph nodes. Thyroid gland, trachea, and esophagus demonstrate no significant findings. Lungs/Pleura: Lungs are clear. No pleural effusion or pneumothorax. Bibasilar dependent atelectasis. Upper Abdomen: No acute abnormality.  Small hiatal hernia. Musculoskeletal: No chest wall abnormality. No acute or significant osseous findings. Review of the MIP images confirms the above findings. IMPRESSION: 1.  No pulmonary embolism or acute  cardiopulmonary process. 2.   Small hiatal hernia. Electronically Signed   By: Larose Hires D.O.   On: 02/23/2021 08:37   CT ABDOMEN PELVIS W CONTRAST  Result Date: 02/23/2021 CLINICAL DATA:  Sepsis.  Body aches and generalized weakness EXAM: CT ABDOMEN AND PELVIS WITH CONTRAST TECHNIQUE: Multidetector CT imaging of the abdomen and pelvis was performed using the standard protocol following bolus administration of intravenous contrast. RADIATION DOSE REDUCTION: This exam was performed according to the departmental dose-optimization program which includes automated exposure control, adjustment of the mA and/or kV according to patient size and/or use of iterative reconstruction technique. CONTRAST:   OMNIPAQUE IOHEXOL 350 MG/ML SOLN COMPARISON:  None. FINDINGS: Lower chest: No acute abnormality. Hepatobiliary: No suspicious liver abnormality. Status post cholecystectomy. No bile duct dilatation. Pancreas: Unremarkable. No pancreatic ductal dilatation or surrounding inflammatory changes. Spleen: Normal in size without focal abnormality. Adrenals/Urinary Tract: Right adrenal nodule measures 1.7 cm and 54.5 Hounsfield units, image 58/6. Normal left adrenal gland. No kidney stone, mass or hydronephrosis identified bilaterally. No hydroureter or ureteral lithiasis. Urinary bladder is unremarkable. Stomach/Bowel: Small hiatal hernia. The appendix is visualized and is normal. No bowel wall thickening, inflammation, or distension. Sigmoid diverticulosis without signs of acute diverticulitis. Vascular/Lymphatic: Aortic atherosclerosis. No aneurysm. No signs of abdominopelvic adenopathy. Reproductive: Uterus and bilateral adnexa are unremarkable. Other: No ascites or focal fluid collections. Musculoskeletal: No acute or significant osseous findings. Degenerative disc disease noted at L5-S1. IMPRESSION: 1. No acute findings within the abdomen or pelvis. 2. Sigmoid diverticulosis without signs of acute diverticulitis. 3.  Small hiatal hernia. 4. Indeterminate right adrenal nodule measuring 1.7 cm. In the absence of known malignancy this is most likely a benign adenoma. A follow-up adrenal protocol CT in 12 months may be considered. 5. Aortic Atherosclerosis (ICD10-I70.0). Electronically Signed   By: Signa Kell M.D.   On: 02/23/2021 08:33     CODE STATUS:     Code Status Orders  (From admission, onward)           Start     Ordered   02/23/21 0918  Full code  Continuous        02/23/21 0917           Code Status History     This patient has a current code status but no historical code status.        TOTAL TIME TAKING CARE OF THIS PATIENT: 35 minutes.    Enedina Finner M.D  [facility]    CC: Primary care physician; Butler Denmark, MD

## 2021-02-24 NOTE — Care Management CC44 (Signed)
Condition Code 44 Documentation Completed  Patient Details  Name: Kristen Riley MRN: [id number] Date of Birth: 1950/12/22   Condition Code 44 given:  Yes Patient signature on Condition Code 44 notice:  Yes Documentation of 2 MD's agreement:  Yes Code 44 added to claim:  Yes    Marlowe Sax, RN 02/24/2021, 11:20 AM

## 2021-02-24 NOTE — TOC Progression Note (Signed)
Transition of Care [facility]) - Progression Note    Patient Details  Name: Kristen Riley MRN: [id number] Date of Birth: [birth year]  Transition of Care [facility]) CM/SW [place], RN Phone Number: 02/24/2021, 11:25 AM  Clinical Narrative:   met with the patient and her daughter in the room, She lives at home with her husband, he will pick her up today from the Hospital, she has no needs, Code 4 completed         Expected Discharge Plan and Services           Expected Discharge Date: 02/24/21                                     Social Determinants of Health (SDOH) Interventions    Readmission Risk Interventions No flowsheet data found.

## 2021-02-24 NOTE — Plan of Care (Signed)

## 2021-02-28 LAB — CULTURE, BLOOD (ROUTINE X 2)
Culture: NO GROWTH
Culture: NO GROWTH
Special Requests: ADEQUATE
Special Requests: ADEQUATE

## 2022-09-14 IMAGING — CT CT ANGIO CHEST
2 of 7 series · 18 of 46 positions shown · IV contrast (APPLIED)
Comparison: None.

CLINICAL DATA: Body aches and generalized weakness since this
morning.

EXAM:
CT ANGIOGRAPHY CHEST WITH CONTRAST
TECHNIQUE: Multidetector CT imaging of the chest was performed using the
standard protocol during bolus administration of intravenous
contrast. Multiplanar CT image reconstructions and MIPs were
obtained to evaluate the vascular anatomy.

[Series 5: thins · axial · 0.75mm/px · z∈[-735,-471]mm · 15 of 298 slices shown]
[im 17/298  lung]
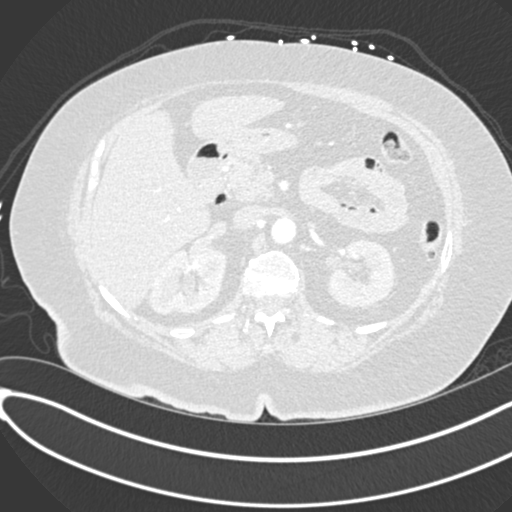
[im 34/298  soft-tissue]
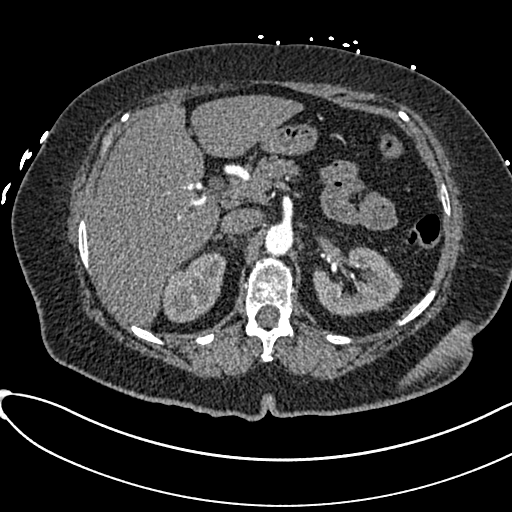
[im 50/298  lung]
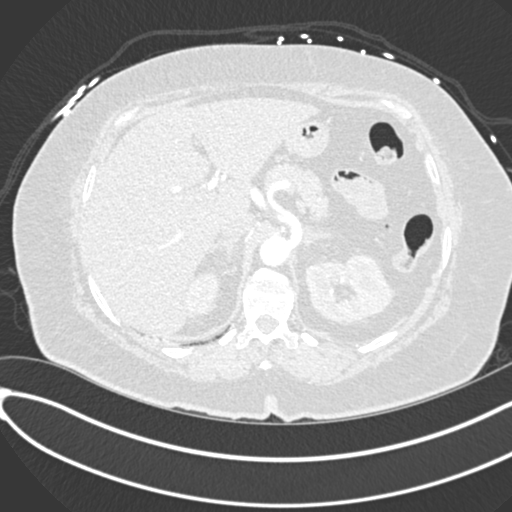
[im 67/298  soft-tissue]
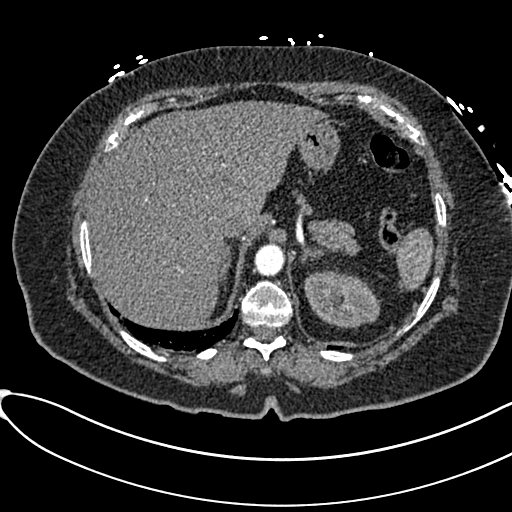
[im 100/298  lung]
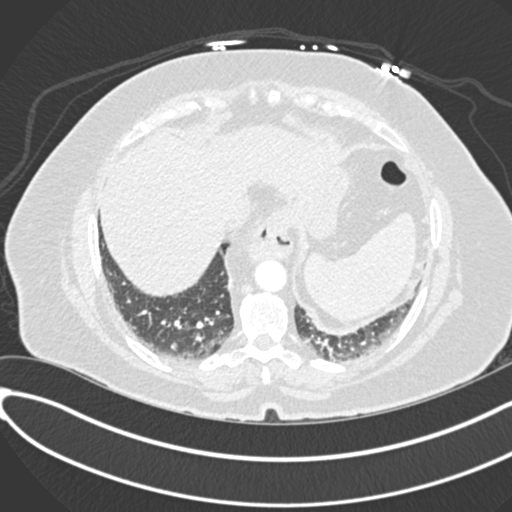
[im 116/298  soft-tissue]
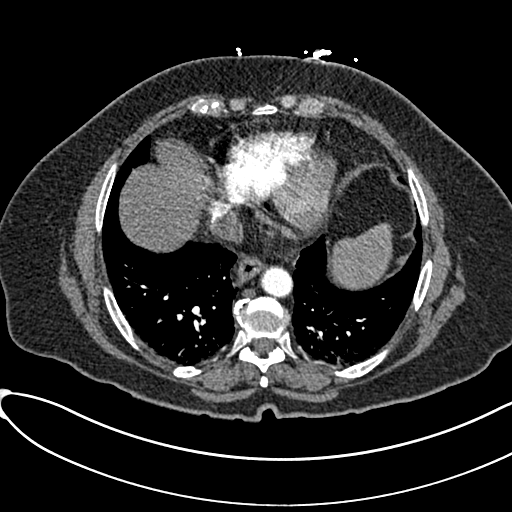
[im 133/298  lung]
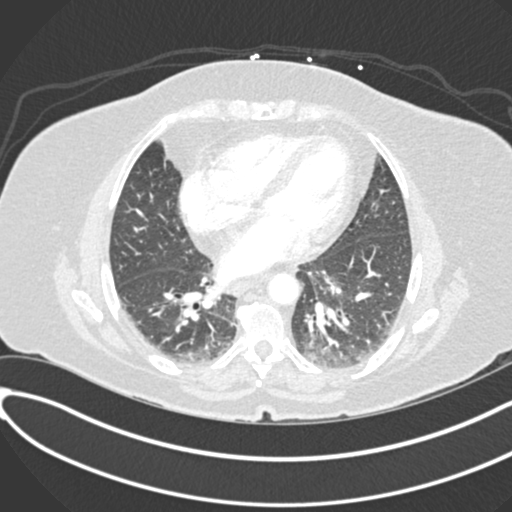
[im 149/298  soft-tissue]
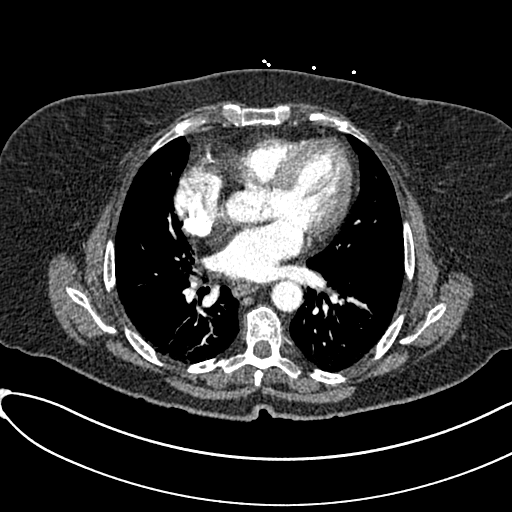
[im 166/298  lung]
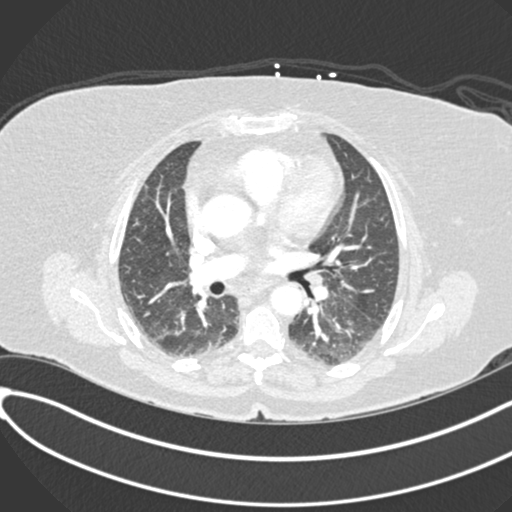
[im 182/298  soft-tissue]
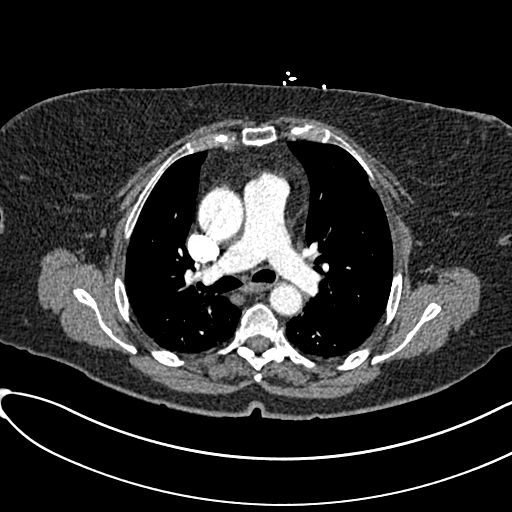
[im 199/298  lung]
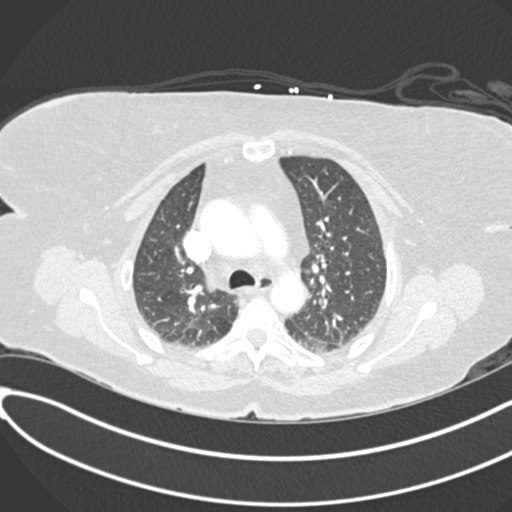
[im 232/298  soft-tissue]
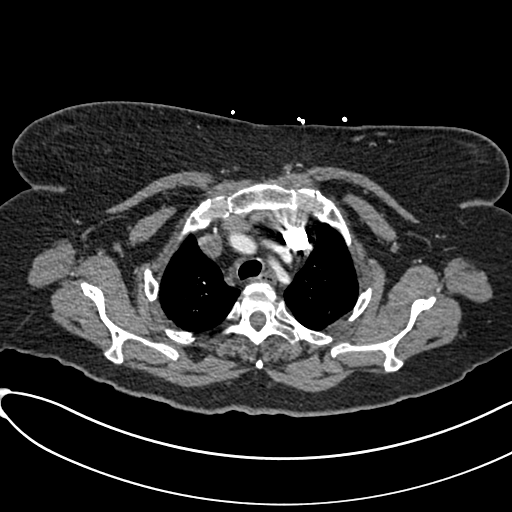
[im 248/298  lung]
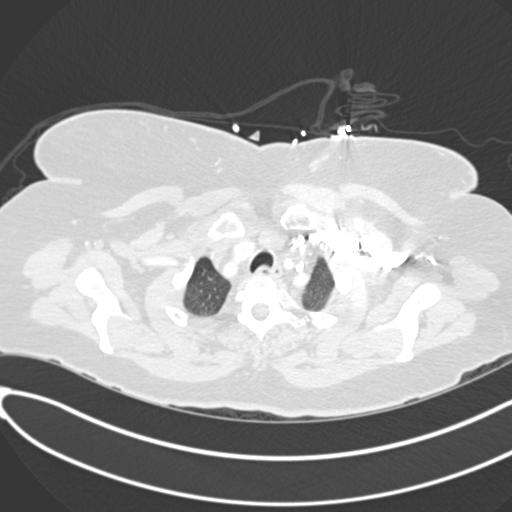
[im 265/298  soft-tissue]
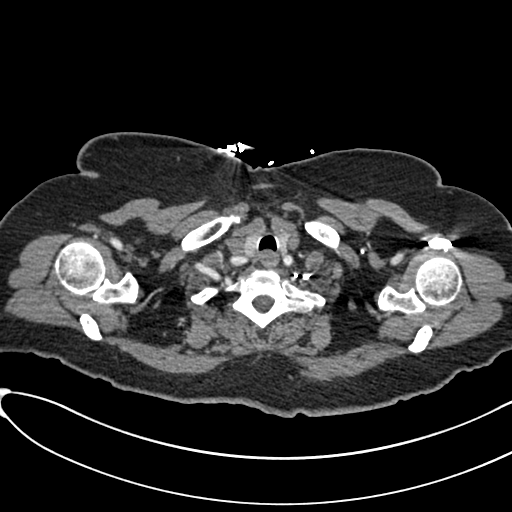
[im 281/298  lung]
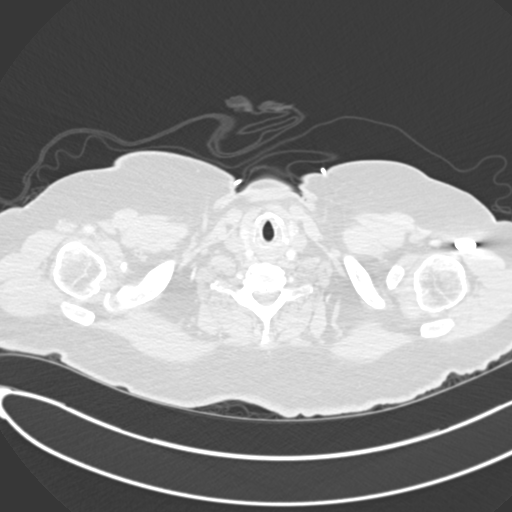

[Series 7: coronal mpr · coronal · 0.58mm/px · 3 of 141 slices shown]
[im 36/141  soft-tissue]
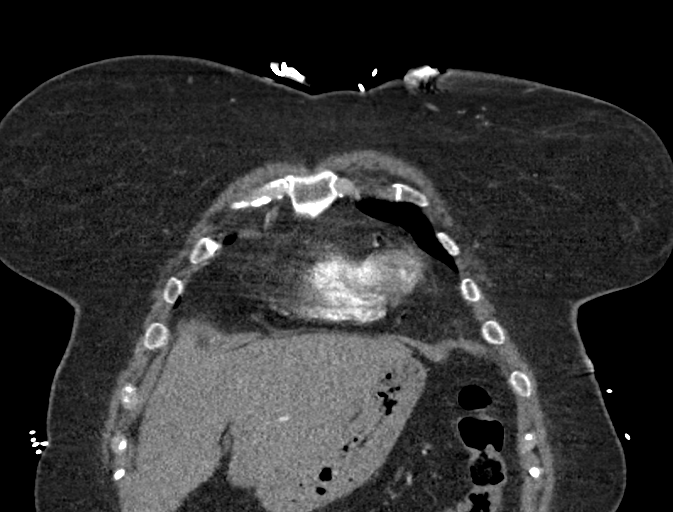
[im 71/141  soft-tissue]
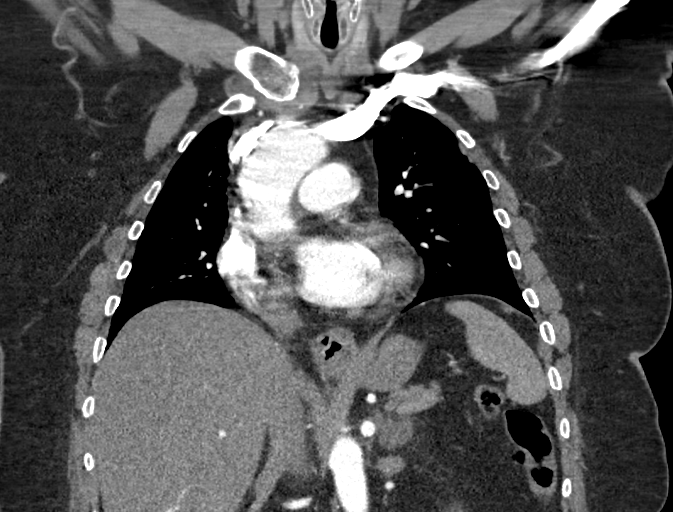
[im 106/141  soft-tissue]
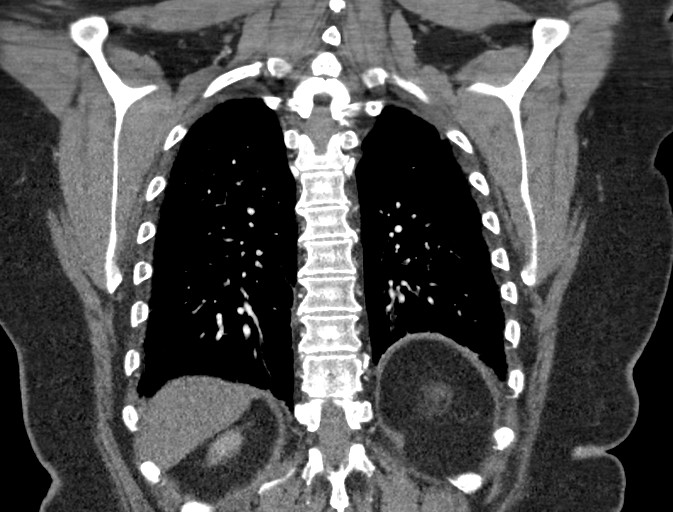

[18 of 46 positions shown; findings below may reference images not displayed]

RADIATION DOSE REDUCTION: This exam was performed according to the
departmental dose-optimization program which includes automated
exposure control, adjustment of the mA and/or kV according to
patient size and/or use of iterative reconstruction technique.

CONTRAST:  100mL OMNIPAQUE IOHEXOL 350 MG/ML SOLN
FINDINGS: Cardiovascular: Satisfactory opacification of the pulmonary arteries
to the segmental level. No evidence of pulmonary embolism. Normal
heart size. No pericardial effusion. Mild coronary artery
atherosclerotic calcifications.2

Mediastinum/Nodes: No enlarged mediastinal, hilar, or axillary lymph
nodes. Thyroid gland, trachea, and esophagus demonstrate no
significant findings.

Lungs/Pleura: Lungs are clear. No pleural effusion or pneumothorax.
Bibasilar dependent atelectasis.

Upper Abdomen: No acute abnormality.  Small hiatal hernia.

Musculoskeletal: No chest wall abnormality. No acute or significant
osseous findings.

Review of the MIP images confirms the above findings.
IMPRESSION: 1.  No pulmonary embolism or acute cardiopulmonary process.

2.   Small hiatal hernia.

## 2022-09-14 IMAGING — CT CT ABD-PELV W/ CM
2 of 5 series · 15 of 46 positions shown, 17 images · IV contrast (APPLIED)
Comparison: None.

CLINICAL DATA: Sepsis.  Body aches and generalized weakness

EXAM:
CT ABDOMEN AND PELVIS WITH CONTRAST
TECHNIQUE: Multidetector CT imaging of the abdomen and pelvis was performed
using the standard protocol following bolus administration of
intravenous contrast.

[Series 3: axial st · axial · 0.83mm/px · z∈[-1009,-624]mm · 12 of 91 slices shown, 14 images]
[im 7/91  soft-tissue]
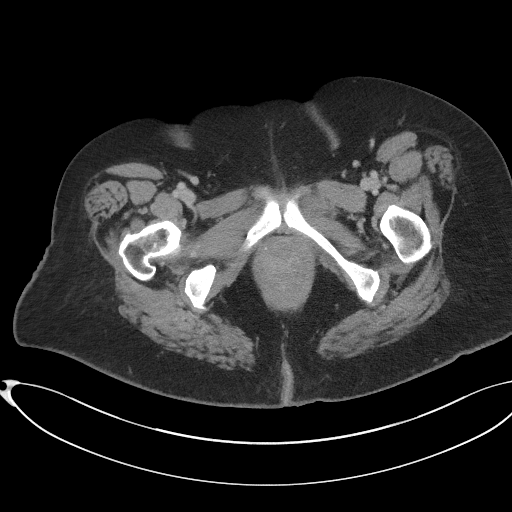
[im 7/91  bone]
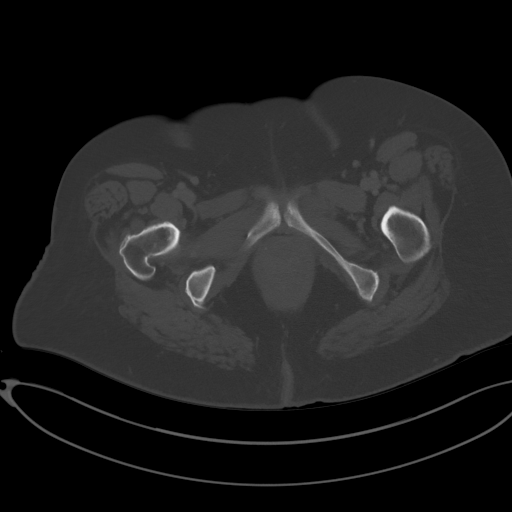
[im 14/91  soft-tissue]
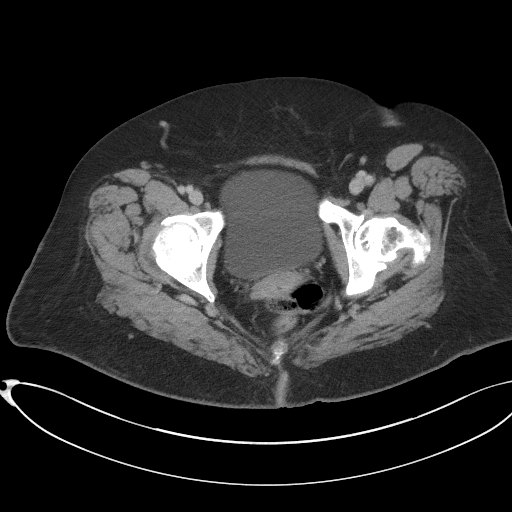
[im 21/91  soft-tissue]
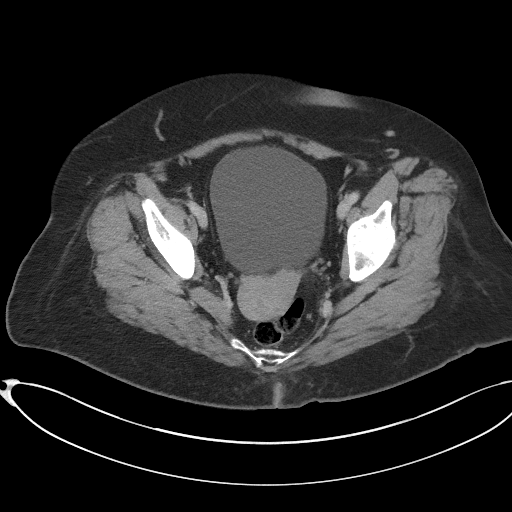
[im 28/91  soft-tissue]
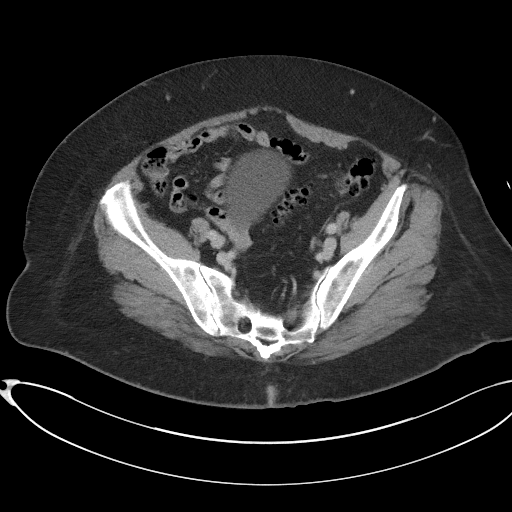
[im 35/91  soft-tissue]
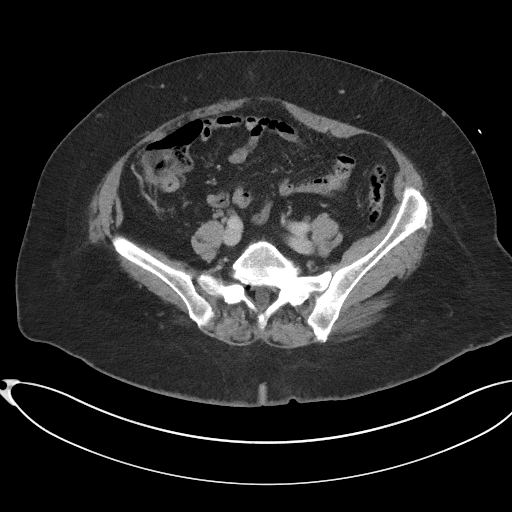
[im 42/91  soft-tissue]
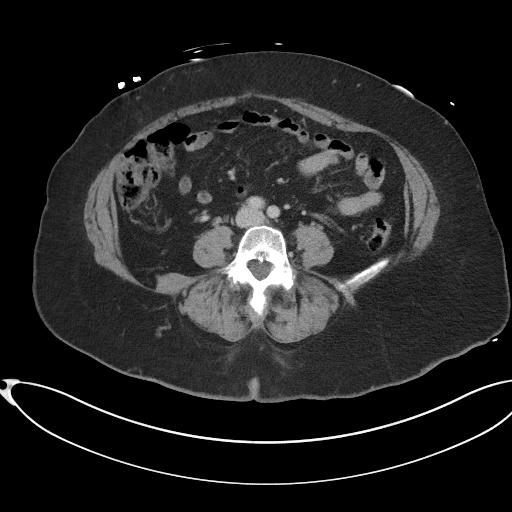
[im 49/91  soft-tissue]
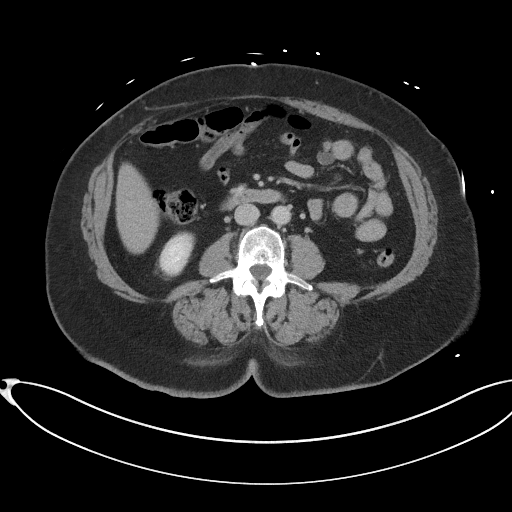
[im 56/91  soft-tissue]
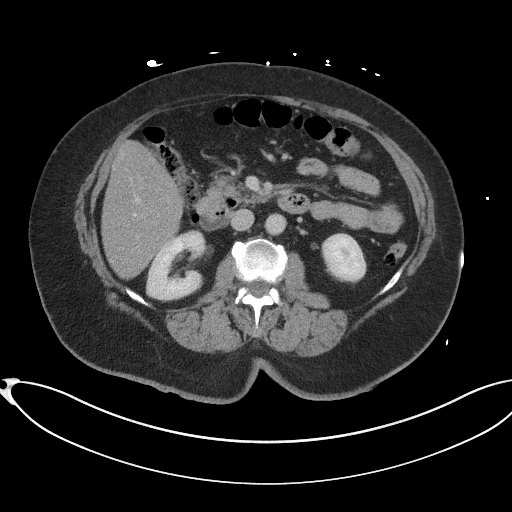
[im 63/91  soft-tissue]
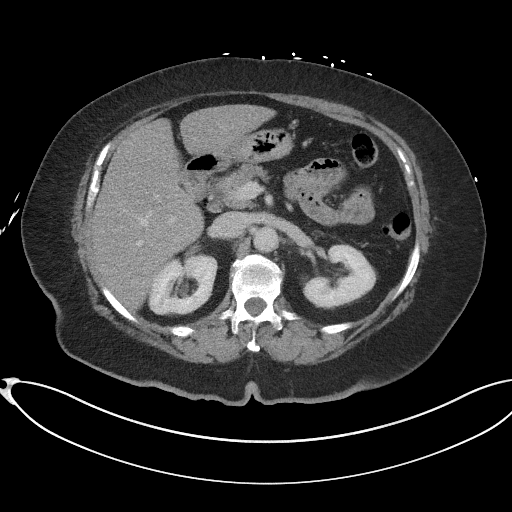
[im 63/91  bone]
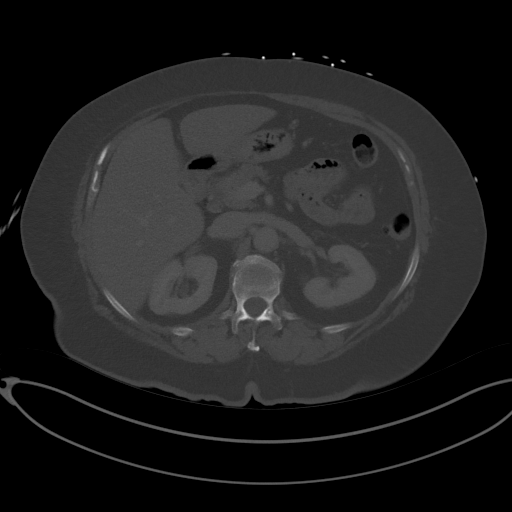
[im 70/91  soft-tissue]
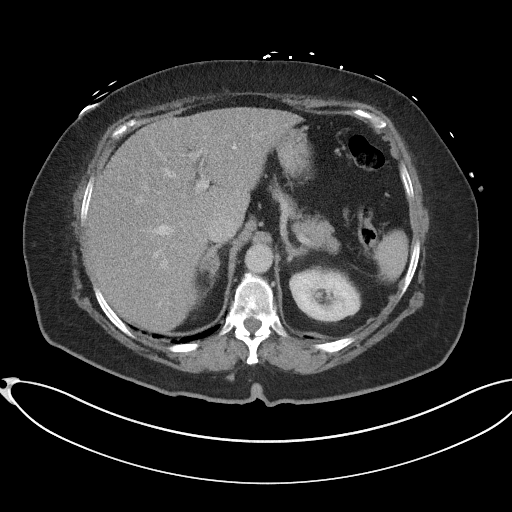
[im 77/91  soft-tissue]
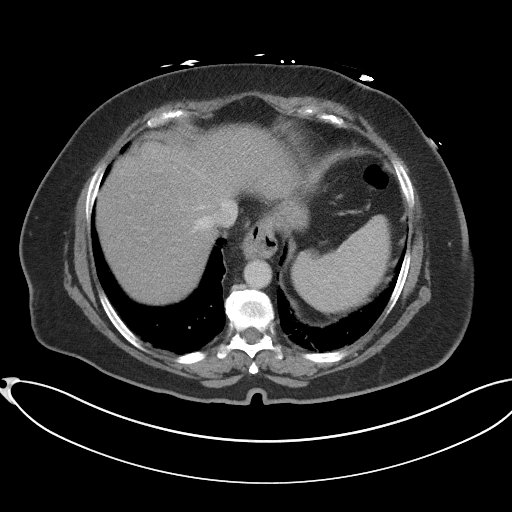
[im 84/91  soft-tissue]
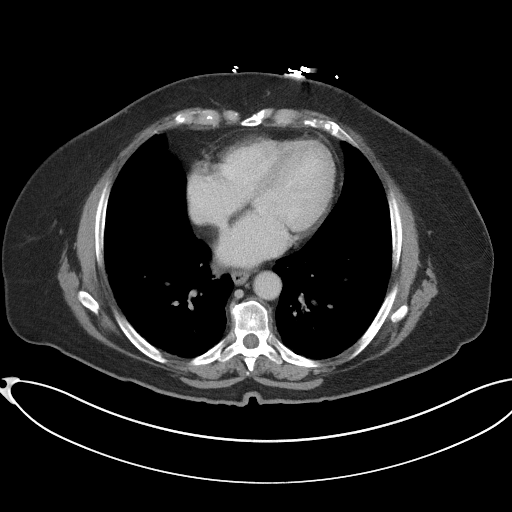

[Series 6: coronal st · coronal · 0.80mm/px · 3 of 95 slices shown]
[im 32/95  soft-tissue]
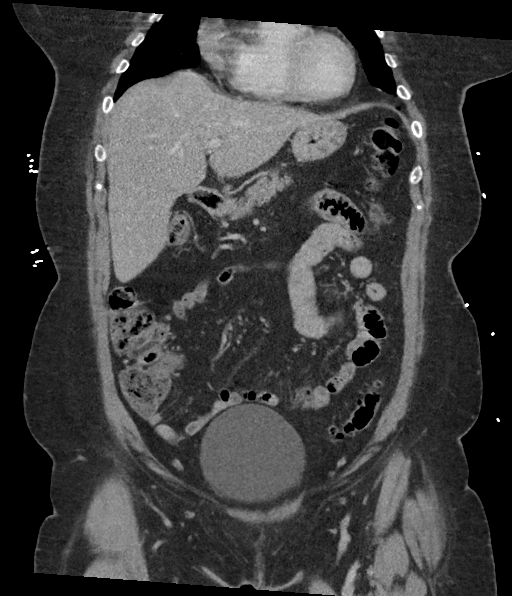
[im 42/95  soft-tissue]
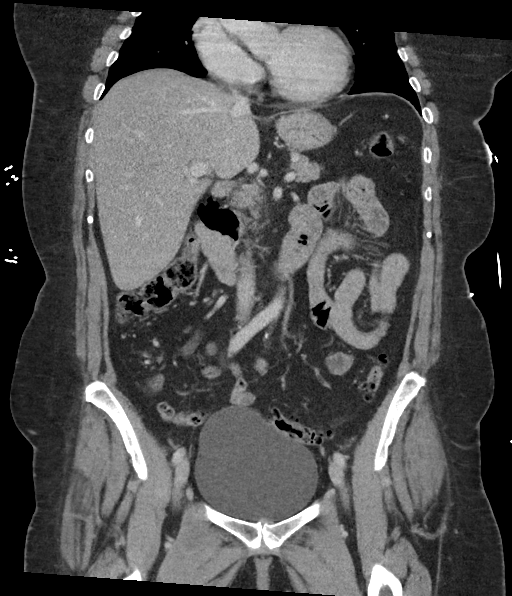
[im 53/95  soft-tissue]
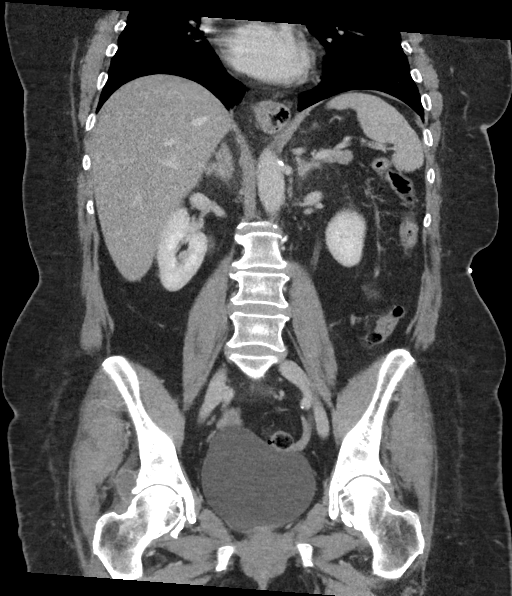

[15 of 46 positions shown; findings below may reference images not displayed]

RADIATION DOSE REDUCTION: This exam was performed according to the
departmental dose-optimization program which includes automated
exposure control, adjustment of the mA and/or kV according to
patient size and/or use of iterative reconstruction technique.

CONTRAST:  100mL OMNIPAQUE IOHEXOL 350 MG/ML SOLN
FINDINGS: Lower chest: No acute abnormality.

Hepatobiliary: No suspicious liver abnormality. Status post
cholecystectomy. No bile duct dilatation.

Pancreas: Unremarkable. No pancreatic ductal dilatation or
surrounding inflammatory changes.

Spleen: Normal in size without focal abnormality.

Adrenals/Urinary Tract: Right adrenal nodule measures 1.7 cm and
54.5 Hounsfield units, image 58/6. Normal left adrenal gland. No
kidney stone, mass or hydronephrosis identified bilaterally. No
hydroureter or ureteral lithiasis. Urinary bladder is unremarkable.

Stomach/Bowel: Small hiatal hernia. The appendix is visualized and
is normal. No bowel wall thickening, inflammation, or distension.
Sigmoid diverticulosis without signs of acute diverticulitis.

Vascular/Lymphatic: Aortic atherosclerosis. No aneurysm. No signs of
abdominopelvic adenopathy.

Reproductive: Uterus and bilateral adnexa are unremarkable.

Other: No ascites or focal fluid collections.

Musculoskeletal: No acute or significant osseous findings.
Degenerative disc disease noted at L5-S1.
IMPRESSION: 1. No acute findings within the abdomen or pelvis.
2. Sigmoid diverticulosis without signs of acute diverticulitis.
3. Small hiatal hernia.
4. Indeterminate right adrenal nodule measuring 1.7 cm. In the
absence of known malignancy this is most likely a benign adenoma. A
follow-up adrenal protocol CT in 12 months may be considered.
5. Aortic Atherosclerosis ([id number]).

## 2023-01-14 ENCOUNTER — Encounter: Payer: Self-pay | Admitting: Ophthalmology

## 2023-01-17 NOTE — Discharge Instructions (Signed)

## 2023-01-18 ENCOUNTER — Encounter: Payer: Self-pay | Admitting: Ophthalmology

## 2023-01-18 NOTE — Anesthesia Preprocedure Evaluation (Signed)
Anesthesia Evaluation  Patient identified by MRN, date of birth, ID band Patient awake    Reviewed: Allergy & Precautions, H&P , NPO status , Patient's Chart, lab work & pertinent test results  Airway Mallampati: III  TM Distance: <3 FB Neck ROM: Full   Comment: Very short TMD, 2 FB, likely anterior airway Dental no notable dental hx.    Pulmonary neg pulmonary ROS, shortness of breath   Pulmonary exam normal breath sounds clear to auscultation       Cardiovascular hypertension, Normal cardiovascular exam Rhythm:Regular Rate:Normal     Neuro/Psych  PSYCHIATRIC DISORDERS Anxiety Depression    negative neurological ROS  negative psych ROS   GI/Hepatic negative GI ROS, Neg liver ROS,GERD  ,,  Endo/Other  negative endocrine ROS    Renal/GU negative Renal ROS  negative genitourinary   Musculoskeletal negative musculoskeletal ROS (+) Arthritis ,    Abdominal   Peds negative pediatric ROS (+)  Hematology negative hematology ROS (+)   Anesthesia Other Findings Hypertension  GERD (gastroesophageal reflux disease) HLD (hyperlipidemia)  Arthritis Dyspnea  Generalized anxiety disorder Depression   Patient reports that she "got crazy-acting after knee replacement," but does not recall that, also nauseated.I can't find anesthesia record for TKR, but would expect likely confusion and amnesia secondary to versed.    Reproductive/Obstetrics negative OB ROS                             Anesthesia Physical Anesthesia Plan  ASA: 2  Anesthesia Plan: MAC   Post-op Pain Management:    Induction: Intravenous  PONV Risk Score and Plan:   Airway Management Planned: Natural Airway and Nasal Cannula  Additional Equipment:   Intra-op Plan:   Post-operative Plan:   Informed Consent: I have reviewed the patients History and Physical, chart, labs and discussed the procedure including the risks,  benefits and alternatives for the proposed anesthesia with the patient or authorized representative who has indicated his/her understanding and acceptance.     Dental Advisory Given  Plan Discussed with: Anesthesiologist, CRNA and Surgeon  Anesthesia Plan Comments: (Patient consented for risks of anesthesia including but not limited to:  - adverse reactions to medications - damage to eyes, teeth, lips or other oral mucosa - nerve damage due to positioning  - sore throat or hoarseness - Damage to heart, brain, nerves, lungs, other parts of body or loss of life  Patient voiced understanding and assent.)        Anesthesia Quick Evaluation

## 2023-01-19 ENCOUNTER — Ambulatory Visit
Admission: RE | Admit: 2023-01-19 | Discharge: 2023-01-19 | Disposition: A | Discharge status: Home / Self Care | Payer: Medicare PPO | Attending: Ophthalmology | Admitting: Ophthalmology

## 2023-01-19 ENCOUNTER — Encounter
Admission: RE | Disposition: A | Discharge status: Home / Self Care | Payer: Self-pay | Source: Home / Self Care | Attending: Ophthalmology

## 2023-01-19 ENCOUNTER — Other Ambulatory Visit: Payer: Self-pay

## 2023-01-19 ENCOUNTER — Ambulatory Visit: Payer: Self-pay | Admitting: Anesthesiology

## 2023-01-19 ENCOUNTER — Encounter: Payer: Self-pay | Admitting: Ophthalmology

## 2023-01-19 DIAGNOSIS — K219 Gastro-esophageal reflux disease without esophagitis: Secondary | ICD-10-CM | POA: Insufficient documentation

## 2023-01-19 DIAGNOSIS — H2512 Age-related nuclear cataract, left eye: Secondary | ICD-10-CM | POA: Diagnosis present

## 2023-01-19 DIAGNOSIS — I10 Essential (primary) hypertension: Secondary | ICD-10-CM | POA: Insufficient documentation

## 2023-01-19 HISTORY — PX: CATARACT EXTRACTION W/PHACO: SHX586

## 2023-01-19 HISTORY — DX: Dyspnea, unspecified: R06.00

## 2023-01-19 HISTORY — DX: Other specified postprocedural states: Z98.890

## 2023-01-19 HISTORY — DX: Unspecified osteoarthritis, unspecified site: M19.90

## 2023-01-19 HISTORY — DX: Nausea with vomiting, unspecified: R11.2

## 2023-01-19 HISTORY — DX: Generalized anxiety disorder: F41.1

## 2023-01-19 HISTORY — DX: Depression, unspecified: F32.A

## 2023-01-19 SURGERY — PHACOEMULSIFICATION, CATARACT, WITH IOL INSERTION
Anesthesia: Monitor Anesthesia Care | Site: Eye | Laterality: Left

## 2023-01-19 MED ORDER — SIGHTPATH DOSE#1 NA HYALUR & NA CHOND-NA HYALUR IO KIT
PACK | INTRAOCULAR | Status: DC | PRN
  Administered 2023-01-19: 1 via OPHTHALMIC

## 2023-01-19 MED ORDER — FENTANYL CITRATE (PF) 100 MCG/2ML IJ SOLN
INTRAMUSCULAR | Status: AC
  Filled 2023-01-19: qty 2

## 2023-01-19 MED ORDER — ARMC OPHTHALMIC DILATING DROPS
1.0000 | OPHTHALMIC | Status: DC | PRN
  Administered 2023-01-19 (×3): 1 via OPHTHALMIC

## 2023-01-19 MED ORDER — SIGHTPATH DOSE#1 BSS IO SOLN
INTRAOCULAR | Status: DC | PRN
  Administered 2023-01-19: 63 mL via OPHTHALMIC

## 2023-01-19 MED ORDER — DEXMEDETOMIDINE HCL IN NACL 80 MCG/20ML IV SOLN
INTRAVENOUS | Status: AC
  Filled 2023-01-19: qty 20

## 2023-01-19 MED ORDER — SIGHTPATH DOSE#1 BSS IO SOLN
INTRAOCULAR | Status: DC | PRN
  Administered 2023-01-19: 1 mL

## 2023-01-19 MED ORDER — SIGHTPATH DOSE#1 BSS IO SOLN
INTRAOCULAR | Status: DC | PRN
  Administered 2023-01-19: 15 mL

## 2023-01-19 MED ORDER — MIDAZOLAM HCL 2 MG/2ML IJ SOLN
INTRAMUSCULAR | Status: AC
Start: 2023-01-19 — End: ?
  Filled 2023-01-19: qty 2

## 2023-01-19 MED ORDER — TETRACAINE HCL 0.5 % OP SOLN
1.0000 [drp] | OPHTHALMIC | Status: DC | PRN
  Administered 2023-01-19 (×3): 1 [drp] via OPHTHALMIC

## 2023-01-19 MED ORDER — BRIMONIDINE TARTRATE-TIMOLOL 0.2-0.5 % OP SOLN
OPHTHALMIC | Status: DC | PRN
  Administered 2023-01-19: 1 [drp] via OPHTHALMIC

## 2023-01-19 MED ORDER — TETRACAINE HCL 0.5 % OP SOLN
OPHTHALMIC | Status: AC
  Filled 2023-01-19: qty 4

## 2023-01-19 MED ORDER — DEXMEDETOMIDINE HCL IN NACL 200 MCG/50ML IV SOLN
INTRAVENOUS | Status: DC | PRN
  Administered 2023-01-19 (×3): 10 ug via INTRAVENOUS

## 2023-01-19 MED ORDER — CEFUROXIME OPHTHALMIC INJECTION 1 MG/0.1 ML
INJECTION | OPHTHALMIC | Status: DC | PRN
  Administered 2023-01-19: .1 mL via INTRACAMERAL

## 2023-01-19 SURGICAL SUPPLY — 17 items
CANNULA ANT/CHMB 27G (MISCELLANEOUS) IMPLANT
CANNULA ANT/CHMB 27GA (MISCELLANEOUS)
CATARACT SUITE SIGHTPATH (MISCELLANEOUS) ×1
FEE CATARACT SUITE SIGHTPATH (MISCELLANEOUS) ×1 IMPLANT
GLOVE SRG 8 PF TXTR STRL LF DI (GLOVE) ×1 IMPLANT
GLOVE SURG ENC TEXT LTX SZ7.5 (GLOVE) ×1 IMPLANT
GLOVE SURG GAMMEX PI TX LF 7.5 (GLOVE) IMPLANT
LENS IOL TECNIS EYHANCE 21.0 (Intraocular Lens) IMPLANT
NDL FILTER BLUNT 18X1 1/2 (NEEDLE) ×1 IMPLANT
NDL RETROBULBAR .5 NSTRL (NEEDLE) IMPLANT
NEEDLE FILTER BLUNT 18X1 1/2 (NEEDLE) ×1
PACK VIT ANT 23G (MISCELLANEOUS) IMPLANT
RING MALYGIN 7.0 (MISCELLANEOUS) IMPLANT
SUT ETHILON 10-0 CS-B-6CS-B-6 (SUTURE)
SUT VICRYL 9 0 (SUTURE) IMPLANT
SUTURE EHLN 10-0 CS-B-6CS-B-6 (SUTURE) IMPLANT
SYR 3ML LL SCALE MARK (SYRINGE) ×1 IMPLANT

## 2023-01-19 NOTE — Transfer of Care (Signed)
Immediate Anesthesia Transfer of Care Note  Patient: Kristen Riley  Procedure(s) Performed: CATARACT EXTRACTION PHACO AND INTRAOCULAR LENS PLACEMENT (IOC) LEFT (Left: Eye)  Patient Location: PACU  Anesthesia Type: MAC  Level of Consciousness: awake, alert  and patient cooperative  Airway and Oxygen Therapy: Patient Spontanous Breathing and Patient connected to supplemental oxygen  Post-op Assessment: Post-op Vital signs reviewed, Patient's Cardiovascular Status Stable, Respiratory Function Stable, Patent Airway and No signs of Nausea or vomiting  Post-op Vital Signs: Reviewed and stable  Complications: No notable events documented.

## 2023-01-19 NOTE — Anesthesia Preprocedure Evaluation (Addendum)
Anesthesia Evaluation  Patient identified by MRN, date of birth, ID band Patient awake    Reviewed: Allergy & Precautions, H&P , NPO status , Patient's Chart, lab work & pertinent test results  History of Anesthesia Complications (+) PONV and history of anesthetic complications  Airway Mallampati: III  TM Distance: <3 FB Neck ROM: Full    Dental no notable dental hx.    Pulmonary neg pulmonary ROS, shortness of breath   Pulmonary exam normal breath sounds clear to auscultation       Cardiovascular hypertension, Normal cardiovascular exam Rhythm:Regular Rate:Normal     Neuro/Psych  PSYCHIATRIC DISORDERS Anxiety Depression    negative neurological ROS  negative psych ROS   GI/Hepatic negative GI ROS, Neg liver ROS,GERD  ,,  Endo/Other  negative endocrine ROS    Renal/GU negative Renal ROS  negative genitourinary   Musculoskeletal negative musculoskeletal ROS (+) Arthritis ,    Abdominal   Peds negative pediatric ROS (+)  Hematology negative hematology ROS (+)   Anesthesia Other Findings Previous cataract surgery 01-19-23 Dr. Juel Burrow and Domenic Moras CRNA Precedex IV previously.   Requests no versed, due to: Patient reports that she "got crazy-acting after knee replacement," but does not recall that, also nauseated. I was unable to find anesthesia record for TKR, but would expect likely confusion and amnesia secondary to versed, so previous cataract surgery had precedex and did well.   Hypertension  GERD (gastroesophageal reflux disease) HLD (hyperlipidemia) Arthritis Dyspnea  Generalized anxiety disorder Depression  PONV (postoperative nausea and vomiting)     Reproductive/Obstetrics negative OB ROS                             Anesthesia Physical Anesthesia Plan  ASA: 3  Anesthesia Plan: MAC   Post-op Pain Management:    Induction: Intravenous  PONV Risk Score and  Plan:   Airway Management Planned: Natural Airway and Nasal Cannula  Additional Equipment:   Intra-op Plan:   Post-operative Plan:   Informed Consent: I have reviewed the patients History and Physical, chart, labs and discussed the procedure including the risks, benefits and alternatives for the proposed anesthesia with the patient or authorized representative who has indicated his/her understanding and acceptance.     Dental Advisory Given  Plan Discussed with: Anesthesiologist, CRNA and Surgeon  Anesthesia Plan Comments: (Patient consented for risks of anesthesia including but not limited to:  - adverse reactions to medications - damage to eyes, teeth, lips or other oral mucosa - nerve damage due to positioning  - sore throat or hoarseness - Damage to heart, brain, nerves, lungs, other parts of body or loss of life  Patient voiced understanding and assent.)        Anesthesia Quick Evaluation

## 2023-01-19 NOTE — Anesthesia Postprocedure Evaluation (Signed)
Anesthesia Post Note  Patient: Kristen Riley  Procedure(s) Performed: CATARACT EXTRACTION PHACO AND INTRAOCULAR LENS PLACEMENT (IOC) LEFT (Left: Eye)  Patient location during evaluation: PACU Anesthesia Type: MAC Level of consciousness: awake and alert Pain management: pain level controlled Vital Signs Assessment: post-procedure vital signs reviewed and stable Respiratory status: spontaneous breathing, nonlabored ventilation, respiratory function stable and patient connected to nasal cannula oxygen Cardiovascular status: stable and blood pressure returned to baseline Postop Assessment: no apparent nausea or vomiting Anesthetic complications: no   No notable events documented.   Last Vitals:  Vitals:   01/19/23 0922 01/19/23 0927  BP: 118/69 105/67  Pulse: 91 89  Resp: (!) 22 (!) 32  Temp: (!) 36.2 C (!) 36.2 C  SpO2: 97% 95%    Last Pain:  Vitals:   01/19/23 0927  TempSrc:   PainSc: 0-No pain                 Marisue Humble

## 2023-01-19 NOTE — Op Note (Signed)
OPERATIVE NOTE  Kristen Riley [id number] 01/19/2023   PREOPERATIVE DIAGNOSIS:  Nuclear sclerotic cataract left eye. H25.12   POSTOPERATIVE DIAGNOSIS:    Nuclear sclerotic cataract left eye.     PROCEDURE:  Phacoemusification with posterior chamber intraocular lens placement of the left eye  Ultrasound time: Procedure(s) with comments: CATARACT EXTRACTION PHACO AND INTRAOCULAR LENS PLACEMENT (IOC) LEFT (Left) - 7.43 0:40.3  LENS:   Implant Name Type Inv. Item Serial No. Manufacturer Lot No. LRB No. Used Action  LENS IOL TECNIS EYHANCE 21.0 - [id number] Intraocular Lens LENS IOL TECNIS EYHANCE 21.0 [id number] [employer]  Left 1 Implanted      SURGEON:  Deirdre Evener, MD   ANESTHESIA:  Topical with tetracaine drops and 2% Xylocaine jelly, augmented with 1% preservative-free intracameral lidocaine.    COMPLICATIONS:  None.   DESCRIPTION OF PROCEDURE:  The patient was identified in the holding room and transported to the operating room and placed in the supine position under the operating microscope.  The left eye was identified as the operative eye and it was prepped and draped in the usual sterile ophthalmic fashion.   A 1 millimeter clear-corneal paracentesis was made at the 1:30 position.  0.5 ml of preservative-free 1% lidocaine was injected into the anterior chamber.  The anterior chamber was filled with Viscoat viscoelastic.  A 2.4 millimeter keratome was used to make a near-clear corneal incision at the 10:30 position.  .  A curvilinear capsulorrhexis was made with a cystotome and capsulorrhexis forceps.  Balanced salt solution was used to hydrodissect and hydrodelineate the nucleus.   Phacoemulsification was then used in stop and chop fashion to remove the lens nucleus and epinucleus.  The remaining cortex was then removed using the irrigation and aspiration handpiece. Provisc was then placed into the capsular bag to distend it for lens placement.  A lens was then injected  into the capsular bag.  The remaining viscoelastic was aspirated.   Wounds were hydrated with balanced salt solution.  The anterior chamber was inflated to a physiologic pressure with balanced salt solution.  No wound leaks were noted. Cefuroxime 0.1 ml of a 10mg /ml solution was injected into the anterior chamber for a dose of 1 mg of intracameral antibiotic at the completion of the case.   Timolol and Brimonidine drops were applied to the eye.  The patient was taken to the recovery room in stable condition without complications of anesthesia or surgery.  [name] 01/19/2023, 9:21 AM

## 2023-01-19 NOTE — H&P (Signed)
[facility]   Primary Care Physician:  Butler Denmark, MD Ophthalmologist: Dr. Lockie Mola  Pre-Procedure History & Physical: HPI:  Kristen Riley is a 72 y.o. female here for ophthalmic surgery.   Past Medical History:  Diagnosis Date   Arthritis    Depression    Dyspnea    Generalized anxiety disorder    GERD (gastroesophageal reflux disease)    HLD (hyperlipidemia)    Hypertension     Past Surgical History:  Procedure Laterality Date   CHOLECYSTECTOMY      Prior to Admission medications   Medication Sig Start Date End Date Taking? Authorizing Provider  amLODipine (NORVASC) 2.5 MG tablet Take 2.5 mg by mouth daily. 08/25/19  Yes [provider]  diclofenac (VOLTAREN) 75 MG EC tablet Take 1 tablet by mouth daily. 01/21/21  Yes [provider]  lidocaine (XYLOCAINE) 2 % solution Use as directed 15 mLs in the mouth or throat every 3 (three) hours as needed for mouth pain (swish and spit). 10/26/20  Yes Eusebio Friendly B, PA-C  pantoprazole (PROTONIX) 20 MG tablet ok 06/20/19  Yes [provider]  venlafaxine XR (EFFEXOR-XR) 75 MG 24 hr capsule Take by mouth. 06/20/19  Yes [provider]  Vitamin D, Ergocalciferol, (DRISDOL) 1.25 MG (50000 UNIT) CAPS capsule Take 50,000 Units by mouth every 7 (seven) days.   Yes [provider]  phenol (CHLORASEPTIC) 1.4 % LIQD Use as directed 1 spray in the mouth or throat as needed for throat irritation / pain. Patient not taking: Reported on 01/14/2023 02/24/21   Enedina Finner, MD  pravastatin (PRAVACHOL) 40 MG tablet Take 40 mg by mouth at bedtime. Patient not taking: Reported on 01/14/2023 06/20/19   [provider]    Allergies as of 12/30/2022 - Review Complete 02/23/2021  Allergen Reaction Noted   Other  09/24/2017   Statins  02/23/2021    Family History  Problem Relation Age of Onset   Diabetes Brother     Social History   Socioeconomic History   Marital status:  Married    Spouse name: Not on file   Number of children: Not on file   Years of education: Not on file   Highest education level: Not on file  Occupational History   Not on file  Tobacco Use   Smoking status: Never   Smokeless tobacco: Never  Vaping Use   Vaping status: Never Used  Substance and Sexual Activity   Alcohol use: Not Currently   Drug use: Never   Sexual activity: Not on file  Other Topics Concern   Not on file  Social History Narrative   Not on file   Social Determinants of Health   Financial Resource Strain: Low Risk  (03/29/2022)   Received from [facility]   Overall Financial Resource Strain (CARDIA)    Difficulty of Paying Living Expenses: Not hard at all  Food Insecurity: No Food Insecurity (03/29/2022)   Received from [facility]   Hunger Vital Sign    Worried About Running Out of Food in the Last Year: Never true    Ran Out of Food in the Last Year: Never true  Transportation Needs: No Transportation Needs (03/29/2022)   Received from [facility]   PRAPARE - Transportation    Lack of Transportation (Medical): No    Lack of Transportation (Non-Medical): No  Physical Activity: Not on file  Stress: Not on file  Social Connections: Not on file  Intimate Partner Violence: Not on file    Review of Systems: See HPI, otherwise negative ROS  Physical Exam: Ht 5' (1.524 m)   Wt 81.6 kg   BMI 35.15 kg/m  General:   Alert,  pleasant and cooperative in NAD Head:  Normocephalic and atraumatic. Lungs:  Clear to auscultation.    Heart:  Regular rate and rhythm.   Impression/Plan: Kristen Riley is here for ophthalmic surgery.  Risks, benefits, limitations, and alternatives regarding ophthalmic surgery have been reviewed with the patient.  Questions have been answered.  All parties agreeable.   Lockie Mola, MD  01/19/2023, 8:00 AM]

## 2023-01-20 ENCOUNTER — Encounter: Payer: Self-pay | Admitting: Ophthalmology

## 2023-01-24 NOTE — Discharge Instructions (Signed)

## 2023-01-26 ENCOUNTER — Encounter: Payer: Self-pay | Admitting: Ophthalmology

## 2023-01-26 ENCOUNTER — Encounter
Admission: RE | Disposition: A | Discharge status: Home / Self Care | Payer: Self-pay | Source: Home / Self Care | Attending: Ophthalmology

## 2023-01-26 ENCOUNTER — Ambulatory Visit
Admission: RE | Admit: 2023-01-26 | Discharge: 2023-01-26 | Disposition: A | Discharge status: Home / Self Care | Payer: Medicare PPO | Attending: Ophthalmology | Admitting: Ophthalmology

## 2023-01-26 ENCOUNTER — Other Ambulatory Visit: Payer: Self-pay

## 2023-01-26 ENCOUNTER — Ambulatory Visit: Payer: Medicare PPO | Admitting: Anesthesiology

## 2023-01-26 DIAGNOSIS — K219 Gastro-esophageal reflux disease without esophagitis: Secondary | ICD-10-CM | POA: Insufficient documentation

## 2023-01-26 DIAGNOSIS — H2511 Age-related nuclear cataract, right eye: Secondary | ICD-10-CM | POA: Diagnosis present

## 2023-01-26 DIAGNOSIS — I10 Essential (primary) hypertension: Secondary | ICD-10-CM | POA: Insufficient documentation

## 2023-01-26 HISTORY — PX: CATARACT EXTRACTION W/PHACO: SHX586

## 2023-01-26 SURGERY — PHACOEMULSIFICATION, CATARACT, WITH IOL INSERTION
Anesthesia: Monitor Anesthesia Care | Site: Eye | Laterality: Right

## 2023-01-26 MED ORDER — ARMC OPHTHALMIC DILATING DROPS
1.0000 | OPHTHALMIC | Status: DC | PRN
Start: 2023-01-26 — End: 2023-01-26
  Administered 2023-01-26 (×3): 1 via OPHTHALMIC

## 2023-01-26 MED ORDER — ONDANSETRON HCL 4 MG/2ML IJ SOLN
INTRAMUSCULAR | Status: AC
  Filled 2023-01-26: qty 2

## 2023-01-26 MED ORDER — TETRACAINE HCL 0.5 % OP SOLN
1.0000 [drp] | OPHTHALMIC | Status: DC | PRN
Start: 2023-01-26 — End: 2023-01-26
  Administered 2023-01-26 (×3): 1 [drp] via OPHTHALMIC

## 2023-01-26 MED ORDER — SIGHTPATH DOSE#1 BSS IO SOLN
INTRAOCULAR | Status: DC | PRN
  Administered 2023-01-26: 62 mL via OPHTHALMIC

## 2023-01-26 MED ORDER — ARMC OPHTHALMIC DILATING DROPS
OPHTHALMIC | Status: AC
  Filled 2023-01-26: qty 0.5

## 2023-01-26 MED ORDER — MIDAZOLAM HCL 2 MG/2ML IJ SOLN
INTRAMUSCULAR | Status: DC | PRN
  Administered 2023-01-26: 2 mg via INTRAVENOUS

## 2023-01-26 MED ORDER — BRIMONIDINE TARTRATE-TIMOLOL 0.2-0.5 % OP SOLN
OPHTHALMIC | Status: DC | PRN
  Administered 2023-01-26: 1 [drp] via OPHTHALMIC

## 2023-01-26 MED ORDER — SIGHTPATH DOSE#1 BSS IO SOLN
INTRAOCULAR | Status: DC | PRN
  Administered 2023-01-26: 2 mL

## 2023-01-26 MED ORDER — ONDANSETRON HCL 4 MG/2ML IJ SOLN
INTRAMUSCULAR | Status: DC | PRN
  Administered 2023-01-26: 4 mg via INTRAVENOUS

## 2023-01-26 MED ORDER — SIGHTPATH DOSE#1 NA HYALUR & NA CHOND-NA HYALUR IO KIT
PACK | INTRAOCULAR | Status: DC | PRN
  Administered 2023-01-26: 1 via OPHTHALMIC

## 2023-01-26 MED ORDER — MOXIFLOXACIN HCL 0.5 % OP SOLN: OPHTHALMIC | Status: DC | PRN

## 2023-01-26 MED ORDER — TETRACAINE HCL 0.5 % OP SOLN
OPHTHALMIC | Status: AC
  Filled 2023-01-26: qty 4

## 2023-01-26 MED ORDER — CEFUROXIME OPHTHALMIC INJECTION 1 MG/0.1 ML
INJECTION | OPHTHALMIC | Status: DC | PRN
  Administered 2023-01-26: .1 mL via INTRACAMERAL

## 2023-01-26 MED ORDER — MIDAZOLAM HCL 2 MG/2ML IJ SOLN
INTRAMUSCULAR | Status: AC
  Filled 2023-01-26: qty 2

## 2023-01-26 MED ORDER — SIGHTPATH DOSE#1 BSS IO SOLN
INTRAOCULAR | Status: DC | PRN
  Administered 2023-01-26: 15 mL via INTRAOCULAR

## 2023-01-26 SURGICAL SUPPLY — 10 items
CANNULA ANT/CHMB 27G (MISCELLANEOUS) IMPLANT
CANNULA ANT/CHMB 27GA (MISCELLANEOUS)
CATARACT SUITE SIGHTPATH (MISCELLANEOUS) ×1
FEE CATARACT SUITE SIGHTPATH (MISCELLANEOUS) ×1 IMPLANT
GLOVE SRG 8 PF TXTR STRL LF DI (GLOVE) ×1 IMPLANT
GLOVE SURG ENC TEXT LTX SZ7.5 (GLOVE) ×1 IMPLANT
LENS IOL TECNIS EYHANCE 21.0 (Intraocular Lens) IMPLANT
NDL FILTER BLUNT 18X1 1/2 (NEEDLE) ×1 IMPLANT
NEEDLE FILTER BLUNT 18X1 1/2 (NEEDLE) ×1
SYR 3ML LL SCALE MARK (SYRINGE) ×1 IMPLANT

## 2023-01-26 NOTE — Transfer of Care (Signed)
Immediate Anesthesia Transfer of Care Note  Patient: Kristen Riley  Procedure(s) Performed: CATARACT EXTRACTION PHACO AND INTRAOCULAR LENS PLACEMENT (IOC) RIGHT 8.17 00:48.7 (Right: Eye)  Patient Location: PACU  Anesthesia Type: MAC  Level of Consciousness: awake, alert  and patient cooperative  Airway and Oxygen Therapy: Patient Spontanous Breathing and Patient connected to supplemental oxygen  Post-op Assessment: Post-op Vital signs reviewed, Patient's Cardiovascular Status Stable, Respiratory Function Stable, Patent Airway and No signs of Nausea or vomiting  Post-op Vital Signs: Reviewed and stable  Complications: No notable events documented.

## 2023-01-26 NOTE — H&P (Signed)
[facility]   Primary Care Physician:  Butler Denmark, MD Ophthalmologist: Dr. Lockie Mola  Pre-Procedure History & Physical: HPI:  Kristen Riley is a 72 y.o. female here for ophthalmic surgery.   Past Medical History:  Diagnosis Date   Arthritis    Depression    Depression    Dyspnea    Generalized anxiety disorder    GERD (gastroesophageal reflux disease)    HLD (hyperlipidemia)    Hypertension    PONV (postoperative nausea and vomiting)     Past Surgical History:  Procedure Laterality Date   CATARACT EXTRACTION W/PHACO Left 01/19/2023   Procedure: CATARACT EXTRACTION PHACO AND INTRAOCULAR LENS PLACEMENT (IOC) LEFT;  Surgeon: Lockie Mola, MD;  Location: [facility];  Service: Ophthalmology;  Laterality: Left;  7.43 0:40.3   CHOLECYSTECTOMY      Prior to Admission medications   Medication Sig Start Date End Date Taking? Authorizing Provider  amLODipine (NORVASC) 2.5 MG tablet Take 2.5 mg by mouth daily. 08/25/19  Yes [provider]  diclofenac (VOLTAREN) 75 MG EC tablet Take 1 tablet by mouth daily. 01/21/21  Yes [provider]  lidocaine (XYLOCAINE) 2 % solution Use as directed 15 mLs in the mouth or throat every 3 (three) hours as needed for mouth pain (swish and spit). 10/26/20  Yes Eusebio Friendly B, PA-C  pantoprazole (PROTONIX) 20 MG tablet ok 06/20/19  Yes [provider]  pravastatin (PRAVACHOL) 40 MG tablet Take 40 mg by mouth at bedtime. 06/20/19  Yes [provider]  venlafaxine XR (EFFEXOR-XR) 75 MG 24 hr capsule Take by mouth. 06/20/19  Yes [provider]  Vitamin D, Ergocalciferol, (DRISDOL) 1.25 MG (50000 UNIT) CAPS capsule Take 50,000 Units by mouth every 7 (seven) days.   Yes [provider]  phenol (CHLORASEPTIC) 1.4 % LIQD Use as directed 1 spray in the mouth or throat as needed for throat irritation / pain. Patient not taking: Reported on 01/14/2023 02/24/21   Enedina Finner, MD    Allergies as of 12/30/2022 - Review Complete 02/23/2021  Allergen Reaction Noted   Other  09/24/2017   Statins  02/23/2021    Family History  Problem Relation Age of Onset   Diabetes Brother     Social History   Socioeconomic History   Marital status: Married    Spouse name: Not on file   Number of children: Not on file   Years of education: Not on file   Highest education level: Not on file  Occupational History   Not on file  Tobacco Use   Smoking status: Never   Smokeless tobacco: Never  Vaping Use   Vaping status: Never Used  Substance and Sexual Activity   Alcohol use: Not Currently   Drug use: Never   Sexual activity: Not on file  Other Topics Concern   Not on file  Social History Narrative   Not on file   Social Drivers of Health   Financial Resource Strain: Low Risk  (03/29/2022)   Received from [facility]   Overall Financial Resource Strain (CARDIA)    Difficulty of Paying Living Expenses: Not hard at all  Food Insecurity: No Food Insecurity (03/29/2022)   Received from [facility]   Hunger Vital Sign    Worried About Running Out of Food in the Last Year: Never true    Ran Out of Food in the Last Year: Never true  Transportation Needs: No Transportation Needs (03/29/2022)  Received from [facility] - Transportation    Lack of Transportation (Medical): No    Lack of Transportation (Non-Medical): No  Physical Activity: Not on file  Stress: Not on file  Social Connections: Not on file  Intimate Partner Violence: Not on file    Review of Systems: See HPI, otherwise negative ROS  Physical Exam: BP (!) 154/79   Temp 97.9 F (36.6 C) (Temporal)   Ht 5' (1.524 m)   Wt 83 kg   SpO2 97%   BMI 35.74 kg/m  General:   Alert,  pleasant and cooperative in NAD Head:  Normocephalic and atraumatic. Lungs:  Clear to auscultation.    Heart:  Regular rate and rhythm.   Impression/Plan: Kristen Riley is here for  ophthalmic surgery.  Risks, benefits, limitations, and alternatives regarding ophthalmic surgery have been reviewed with the patient.  Questions have been answered.  All parties agreeable.   Lockie Mola, MD  01/26/2023, 7:29 AM

## 2023-01-26 NOTE — Anesthesia Postprocedure Evaluation (Signed)
Anesthesia Post Note  Patient: NOELI NAGORSKI  Procedure(s) Performed: CATARACT EXTRACTION PHACO AND INTRAOCULAR LENS PLACEMENT (IOC) RIGHT 8.17 00:48.7 (Right: Eye)  Anesthesia Type: MAC Anesthetic complications: no Comments: Mrs. Hinnenkamp is much happier today with her anesthesia and is not nauseated at this time. Is not confused.     No notable events documented.   Last Vitals:  Vitals:   01/26/23 0724 01/26/23 0833  BP: (!) 154/79 102/76  Pulse:  87  Resp:  20  Temp: 36.6 C (!) 36.2 C  SpO2: 97% 94%    Last Pain:  Vitals:   01/26/23 0833  TempSrc:   PainSc: 0-No pain                 Marisue Humble

## 2023-01-26 NOTE — Op Note (Signed)
LOCATION:  [facility]   PREOPERATIVE DIAGNOSIS:    Nuclear sclerotic cataract right eye. H25.11   POSTOPERATIVE DIAGNOSIS:  Nuclear sclerotic cataract right eye.     PROCEDURE:  Phacoemusification with posterior chamber intraocular lens placement of the right eye   ULTRASOUND TIME: Procedure(s): CATARACT EXTRACTION PHACO AND INTRAOCULAR LENS PLACEMENT (IOC) RIGHT 8.17 00:48.7 (Right)  LENS:   Implant Name Type Inv. Item Serial No. Manufacturer Lot No. LRB No. Used Action  LENS IOL TECNIS EYHANCE 21.0 - [id number] Intraocular Lens LENS IOL TECNIS EYHANCE 21.0 [id number] [employer]  Right 1 Implanted         SURGEON:  Deirdre Evener, MD   ANESTHESIA:  Topical with tetracaine drops and 2% Xylocaine jelly, augmented with 1% preservative-free intracameral lidocaine.    COMPLICATIONS:  None.   DESCRIPTION OF PROCEDURE:  The patient was identified in the holding room and transported to the operating room and placed in the supine position under the operating microscope.  The right eye was identified as the operative eye and it was prepped and draped in the usual sterile ophthalmic fashion.   A 1 millimeter clear-corneal paracentesis was made at the 12:00 position.  0.5 ml of preservative-free 1% lidocaine was injected into the anterior chamber. The anterior chamber was filled with Viscoat viscoelastic.  A 2.4 millimeter keratome was used to make a near-clear corneal incision at the 9:00 position.  A curvilinear capsulorrhexis was made with a cystotome and capsulorrhexis forceps.  Balanced salt solution was used to hydrodissect and hydrodelineate the nucleus.   Phacoemulsification was then used in stop and chop fashion to remove the lens nucleus and epinucleus.  The remaining cortex was then removed using the irrigation and aspiration handpiece. Provisc was then placed into the capsular bag to distend it for lens placement.  A lens was then injected into the capsular bag.  The  remaining viscoelastic was aspirated.   Wounds were hydrated with balanced salt solution.  The anterior chamber was inflated to a physiologic pressure with balanced salt solution.  No wound leaks were noted. Cefuroxime 0.1 ml of a 10mg /ml solution was injected into the anterior chamber for a dose of 1 mg of intracameral antibiotic at the completion of the case.     Timolol and Brimonidine drops were applied to the eye.  The patient was taken to the recovery room in stable condition without complications of anesthesia or surgery.   Garlan Drewes 01/26/2023, 8:32 AM

## 2023-01-27 ENCOUNTER — Encounter: Payer: Self-pay | Admitting: Ophthalmology

## 2023-05-04 ENCOUNTER — Ambulatory Visit
Admission: EM | Admit: 2023-05-04 | Discharge: 2023-05-04 | Disposition: A | Discharge status: Home / Self Care | Attending: Family Medicine | Admitting: Family Medicine

## 2023-05-04 DIAGNOSIS — J069 Acute upper respiratory infection, unspecified: Secondary | ICD-10-CM | POA: Insufficient documentation

## 2023-05-04 LAB — RESP PANEL BY RT-PCR (FLU A&B, COVID) ARPGX2
Influenza A by PCR: NEGATIVE
Influenza B by PCR: NEGATIVE
SARS Coronavirus 2 by RT PCR: NEGATIVE

## 2023-05-04 MED ORDER — PROMETHAZINE-DM 6.25-15 MG/5ML PO SYRP: 2.5000 mL | ORAL_SOLUTION | Freq: Three times a day (TID) | ORAL | 0 refills | Status: DC | PRN

## 2023-05-04 NOTE — ED Provider Notes (Signed)
 [facility]    CSN: [id number] Arrival date & time: 05/04/23  1033      History   Chief Complaint Chief Complaint  Patient presents with   Cough    HPI Kristen Riley is a 73 y.o. female  presents for evaluation of URI symptoms for 2 days. Patient reports associated symptoms of cough, congestion, headache.  Had a sore throat this is since resolved.  Denies N/V/D, fevers, ear pain, body aches, shortness of breath. Patient does not have a hx of asthma. Patient is not an active smoker.   Reports no sick contacts but was at some large gatherings recently.  Pt has taken Mucinex OTC for symptoms. Pt has no other concerns at this time.    Cough Associated symptoms: headaches     Past Medical History:  Diagnosis Date   Arthritis    Depression    Depression    Dyspnea    Generalized anxiety disorder    GERD (gastroesophageal reflux disease)    HLD (hyperlipidemia)    Hypertension    PONV (postoperative nausea and vomiting)     Patient Active Problem List   Diagnosis Date Noted   SIRS (systemic inflammatory response syndrome) (HCC) 02/23/2021   Hypertension 02/23/2021   GERD (gastroesophageal reflux disease) 02/23/2021   HLD (hyperlipidemia) 02/23/2021   Sepsis (HCC) 02/23/2021   Acute bronchitis 02/23/2021   Adrenal nodule (HCC) 02/23/2021   Streptococcal infection group A 02/23/2021   Streptococcal pharyngitis 02/23/2021    Past Surgical History:  Procedure Laterality Date   CATARACT EXTRACTION W/PHACO Left 01/19/2023   Procedure: CATARACT EXTRACTION PHACO AND INTRAOCULAR LENS PLACEMENT (IOC) LEFT;  Surgeon: Lockie Mola, MD;  Location: [facility];  Service: Ophthalmology;  Laterality: Left;  7.43 0:40.3   CATARACT EXTRACTION W/PHACO Right 01/26/2023   Procedure: CATARACT EXTRACTION PHACO AND INTRAOCULAR LENS PLACEMENT (IOC) RIGHT 8.17 00:48.7;  Surgeon: Lockie Mola, MD;  Location: [facility];  Service: Ophthalmology;   Laterality: Right;   CHOLECYSTECTOMY      OB History   No obstetric history on file.      Home Medications    Prior to Admission medications   Medication Sig Start Date End Date Taking? Authorizing Provider  promethazine-dextromethorphan (PROMETHAZINE-DM) 6.25-15 MG/5ML syrup Take 2.5 mLs by mouth 3 (three) times daily as needed for cough. 05/04/23  Yes Radford Pax, NP  amLODipine (NORVASC) 2.5 MG tablet Take 2.5 mg by mouth daily. 08/25/19   [provider]  diclofenac (VOLTAREN) 75 MG EC tablet Take 1 tablet by mouth daily. 01/21/21   [provider]  lidocaine (XYLOCAINE) 2 % solution Use as directed 15 mLs in the mouth or throat every 3 (three) hours as needed for mouth pain (swish and spit). 10/26/20   Shirlee Latch, PA-C  pantoprazole (PROTONIX) 20 MG tablet ok 06/20/19  Yes [provider]  phenol (CHLORASEPTIC) 1.4 % LIQD Use as directed 1 spray in the mouth or throat as needed for throat irritation / pain. Patient not taking: Reported on 01/14/2023 02/24/21   Enedina Finner, MD  pravastatin (PRAVACHOL) 40 MG tablet Take 40 mg by mouth at bedtime. 06/20/19   [provider]  venlafaxine XR (EFFEXOR-XR) 75 MG 24 hr capsule Take by mouth. 06/20/19  Yes [provider]  Vitamin D, Ergocalciferol, (DRISDOL) 1.25 MG (50000 UNIT) CAPS capsule Take 50,000 Units by mouth every 7 (seven) days.   Yes [provider]    Family History Family History  Problem Relation Age of Onset   Diabetes Brother     Social History Social History   Tobacco Use   Smoking status: Never   Smokeless tobacco: Never  Vaping Use   Vaping status: Never Used  Substance Use Topics   Alcohol use: Not Currently   Drug use: Never     Allergies   Other and Statins   Review of Systems Review of Systems  HENT:  Positive for congestion.   Respiratory:  Positive for cough.   Neurological:  Positive for headaches.     Physical Exam Triage Vital  Signs ED Triage Vitals  Encounter Vitals Group     BP 05/04/23 1107 123/86     Systolic BP Percentile --      Diastolic BP Percentile --      Pulse --      Resp 05/04/23 1107 18     Temp 05/04/23 1107 98.7 F (37.1 C)     Temp Source 05/04/23 1107 Oral     SpO2 05/04/23 1107 98 %     Weight --      Height --      Head Circumference --      Peak Flow --      Pain Score 05/04/23 1110 0     Pain Loc --      Pain Education --      Exclude from Growth Chart --    No data found.  Updated Vital Signs BP 123/86 (BP Location: Right Arm)   Temp 98.7 F (37.1 C) (Oral)   Resp 18   SpO2 98%   Visual Acuity Right Eye Distance:   Left Eye Distance:   Bilateral Distance:    Right Eye Near:   Left Eye Near:    Bilateral Near:     Physical Exam Vitals and nursing note reviewed.  Constitutional:      General: She is not in acute distress.    Appearance: She is well-developed. She is not ill-appearing.  HENT:     Head: Normocephalic and atraumatic.     Right Ear: Tympanic membrane and ear canal normal.     Left Ear: Tympanic membrane and ear canal normal.     Nose: Congestion present.     Mouth/Throat:     Mouth: Mucous membranes are moist.     Pharynx: Oropharynx is clear. Uvula midline. No oropharyngeal exudate or posterior oropharyngeal erythema.     Tonsils: No tonsillar exudate or tonsillar abscesses.  Eyes:     Conjunctiva/sclera: Conjunctivae normal.     Pupils: Pupils are equal, round, and reactive to light.  Cardiovascular:     Rate and Rhythm: Normal rate and regular rhythm.     Heart sounds: Normal heart sounds.  Pulmonary:     Effort: Pulmonary effort is normal.     Breath sounds: Normal breath sounds. No wheezing or rhonchi.  Musculoskeletal:     Cervical back: Normal range of motion and neck supple.  Lymphadenopathy:     Cervical: No cervical adenopathy.  Skin:    General: Skin is warm and dry.  Neurological:     General: No focal deficit present.      Mental Status: She is alert and oriented to person, place, and time.  Psychiatric:        Mood and Affect: Mood normal.        Behavior: Behavior normal.      UC Treatments / Results  Labs (all labs ordered are listed,  but only abnormal results are displayed) Labs Reviewed  RESP PANEL BY RT-PCR (FLU A&B, COVID) [id number]    EKG   Radiology No results found.  Procedures Procedures (including critical care time)  Medications Ordered in UC Medications - No data to display  Initial Impression / Assessment and Plan / UC Course  I have reviewed the triage vital signs and the nursing notes.  Pertinent labs & imaging results that were available during my care of the patient were reviewed by me and considered in my medical decision making (see chart for details).  Clinical Course as of 05/04/23 1201  Wed May 04, 2023  1201 HR 82 [JM]    Clinical Course User Index [JM] Radford Pax, NP    Reviewed exam and symptoms with patient.  No red flags.  Negative flu and COVID PCR.  Discussed viral illness and symptomatic treatment.  Promethazine DM as needed for cough, side effect profile reviewed.  PCP follow-up symptoms do not improve.  ER precautions reviewed. Final Clinical Impressions(s) / UC Diagnoses   Final diagnoses:  Viral upper respiratory tract infection     Discharge Instructions      You have tested for flu and COVID.  Please treat your symptoms with over the counter tylenol or ibuprofen, humidifier, and rest.  You may take Promethazine DM as needed for your cough.  Please note this medication can make you drowsy.  Do not drink alcohol or drive on this medication.  Viral illnesses can last 7-14 days. Please follow up with your PCP if your symptoms are not improving. Please go to the ER for any worsening symptoms. This includes but is not limited to fever you can not control with tylenol or ibuprofen, you are not able to stay hydrated, you have shortness of breath or chest  pain.  Thank you for choosing [facility] for your healthcare needs. I hope you feel better soon!      ED Prescriptions     Medication Sig Dispense Auth. Provider   promethazine-dextromethorphan (PROMETHAZINE-DM) 6.25-15 MG/5ML syrup Take 2.5 mLs by mouth 3 (three) times daily as needed for cough. 118 mL Radford Pax, NP      PDMP not reviewed this encounter.   Radford Pax, NP 05/04/23 1201

## 2023-05-04 NOTE — Discharge Instructions (Addendum)
 You have tested for flu and COVID.  Please treat your symptoms with over the counter tylenol or ibuprofen, humidifier, and rest.  You may take Promethazine DM as needed for your cough.  Please note this medication can make you drowsy.  Do not drink alcohol or drive on this medication.  Viral illnesses can last 7-14 days. Please follow up with your PCP if your symptoms are not improving. Please go to the ER for any worsening symptoms. This includes but is not limited to fever you can not control with tylenol or ibuprofen, you are not able to stay hydrated, you have shortness of breath or chest pain.  Thank you for choosing [facility] for your healthcare needs. I hope you feel better soon!

## 2023-05-04 NOTE — ED Triage Notes (Signed)
 Cough, headache, sore throat that has resolved, congestion x 2 days. Taking mucinex.

## 2023-06-29 ENCOUNTER — Other Ambulatory Visit: Payer: Self-pay

## 2023-06-29 ENCOUNTER — Emergency Department
Admission: EM | Admit: 2023-06-29 | Discharge: 2023-06-29 | Disposition: A | Discharge status: Home / Self Care | Attending: Emergency Medicine | Admitting: Emergency Medicine

## 2023-06-29 ENCOUNTER — Emergency Department

## 2023-06-29 DIAGNOSIS — N390 Urinary tract infection, site not specified: Secondary | ICD-10-CM | POA: Diagnosis not present

## 2023-06-29 DIAGNOSIS — R42 Dizziness and giddiness: Secondary | ICD-10-CM | POA: Diagnosis present

## 2023-06-29 DIAGNOSIS — I110 Hypertensive heart disease with heart failure: Secondary | ICD-10-CM | POA: Insufficient documentation

## 2023-06-29 DIAGNOSIS — I509 Heart failure, unspecified: Secondary | ICD-10-CM | POA: Diagnosis not present

## 2023-06-29 LAB — TROPONIN I (HIGH SENSITIVITY): Troponin I (High Sensitivity): 8 ng/L (ref <18)

## 2023-06-29 LAB — CBC
HCT: 47.2 % — ABNORMAL HIGH (ref 36.0–46.0)
Hemoglobin: 15 g/dL (ref 12.0–15.0)
MCH: 31.3 pg (ref 26.0–34.0)
MCHC: 31.8 g/dL (ref 30.0–36.0)
MCV: 98.3 fL (ref 80.0–100.0)
Platelets: 304 10*3/uL (ref 150–400)
RBC: 4.8 MIL/uL (ref 3.87–5.11)
RDW: 11.9 % (ref 11.5–15.5)
WBC: 9.6 10*3/uL (ref 4.0–10.5)
nRBC: 0 % (ref 0.0–0.2)

## 2023-06-29 LAB — URINALYSIS, ROUTINE W REFLEX MICROSCOPIC
Bilirubin Urine: NEGATIVE
Glucose, UA: NEGATIVE mg/dL
Hgb urine dipstick: NEGATIVE
Ketones, ur: NEGATIVE mg/dL
Nitrite: NEGATIVE
Protein, ur: NEGATIVE mg/dL
Specific Gravity, Urine: 1.019 (ref 1.005–1.030)
pH: 5 (ref 5.0–8.0)

## 2023-06-29 LAB — COMPREHENSIVE METABOLIC PANEL WITH GFR
ALT: 17 U/L (ref 0–44)
AST: 19 U/L (ref 15–41)
Albumin: 4 g/dL (ref 3.5–5.0)
Alkaline Phosphatase: 54 U/L (ref 38–126)
Anion gap: 14 (ref 5–15)
BUN: 11 mg/dL (ref 8–23)
CO2: 21 mmol/L — ABNORMAL LOW (ref 22–32)
Calcium: 9.4 mg/dL (ref 8.9–10.3)
Chloride: 104 mmol/L (ref 98–111)
Creatinine, Ser: 0.76 mg/dL (ref 0.44–1.00)
GFR, Estimated: >60 mL/min (ref >60)
Glucose, Bld: 140 mg/dL — ABNORMAL HIGH (ref 70–99)
Potassium: 3.6 mmol/L (ref 3.5–5.1)
Sodium: 139 mmol/L (ref 135–145)
Total Bilirubin: 0.5 mg/dL (ref 0.0–1.2)
Total Protein: 7.3 g/dL (ref 6.5–8.1)

## 2023-06-29 MED ORDER — ONDANSETRON 4 MG PO TBDP: 4.0000 mg | ORAL_TABLET | Freq: Three times a day (TID) | ORAL | 0 refills | Status: AC | PRN

## 2023-06-29 MED ORDER — FOSFOMYCIN TROMETHAMINE 3 G PO PACK
3.0000 g | PACK | Freq: Once | ORAL | Status: AC
  Administered 2023-06-29: 3 g via ORAL
  Filled 2023-06-29: qty 3

## 2023-06-29 MED ORDER — ONDANSETRON HCL 4 MG/2ML IJ SOLN
4.0000 mg | Freq: Once | INTRAMUSCULAR | Status: AC
  Administered 2023-06-29: 4 mg via INTRAVENOUS
  Filled 2023-06-29: qty 2

## 2023-06-29 MED ORDER — MECLIZINE HCL 25 MG PO TABS
25.0000 mg | ORAL_TABLET | Freq: Once | ORAL | Status: AC
  Administered 2023-06-29: 25 mg via ORAL
  Filled 2023-06-29: qty 1

## 2023-06-29 MED ORDER — MECLIZINE HCL 25 MG PO TABS: 25.0000 mg | ORAL_TABLET | Freq: Three times a day (TID) | ORAL | 0 refills | Status: AC | PRN

## 2023-06-29 NOTE — ED Notes (Signed)
 Lab called to come stick pt due to pt being difficult stick.

## 2023-06-29 NOTE — ED Triage Notes (Signed)
 EMS brings pt in from home for onset dizziness PTA; no neuro deficits per EMS

## 2023-06-29 NOTE — ED Triage Notes (Signed)
 Pt to ED via [facility] from home c/o dizziness. Started 45 mins ago. Pt reports she was getting up from bed and felt dizzy. Denies any other symptoms. Denies dizziness at this time.

## 2023-06-29 NOTE — ED Notes (Signed)
 Pt discussed with Margery Sheets MD, no new orders at this time. No code stroke

## 2023-06-29 NOTE — Discharge Instructions (Signed)
You may take medicines as needed for dizziness and nausea.  Return to the ER for worsening symptoms, persistent vomiting, difficulty breathing or other concerns.

## 2023-06-29 NOTE — ED Provider Notes (Signed)
 [facility] Provider Note    Event Date/Time   First MD Initiated Contact with Patient 06/29/23 [phone]     (approximate)   History   Dizziness   HPI  VENA BASSINGER is a 73 y.o. female brought to the ED via EMS from home with a chief complaint of dizziness.  Patient awoke from sleep to use the restroom, got back into bed, turned to her side and experienced sudden onset of dizziness which she describes as motion.  Symptoms associated with nausea.  Denies associated altered mental status, slurred speech, facial droop, extremity weakness/numbness or tingling.  Denies fever/chills, chest pain, shortness of breath, abdominal pain, vomiting, vision changes, neck pain or headache.     Past Medical History   Past Medical History:  Diagnosis Date   Arthritis    Depression    Depression    Dyspnea    Generalized anxiety disorder    GERD (gastroesophageal reflux disease)    HLD (hyperlipidemia)    Hypertension    PONV (postoperative nausea and vomiting)      Active Problem List   Patient Active Problem List   Diagnosis Date Noted   SIRS (systemic inflammatory response syndrome) (HCC) 02/23/2021   Hypertension 02/23/2021   GERD (gastroesophageal reflux disease) 02/23/2021   HLD (hyperlipidemia) 02/23/2021   Sepsis (HCC) 02/23/2021   Acute bronchitis 02/23/2021   Adrenal nodule (HCC) 02/23/2021   Streptococcal infection group A 02/23/2021   Streptococcal pharyngitis 02/23/2021     Past Surgical History   Past Surgical History:  Procedure Laterality Date   CATARACT EXTRACTION W/PHACO Left 01/19/2023   Procedure: CATARACT EXTRACTION PHACO AND INTRAOCULAR LENS PLACEMENT (IOC) LEFT;  Surgeon: Annell Kidney, MD;  Location: [facility];  Service: Ophthalmology;  Laterality: Left;  7.43 0:40.3   CATARACT EXTRACTION W/PHACO Right 01/26/2023   Procedure: CATARACT EXTRACTION PHACO AND INTRAOCULAR LENS PLACEMENT (IOC) RIGHT 8.17 00:48.7;   Surgeon: Annell Kidney, MD;  Location: [facility];  Service: Ophthalmology;  Laterality: Right;   CHOLECYSTECTOMY       Home Medications   Prior to Admission medications   Medication Sig Start Date End Date Taking? Authorizing Provider  meclizine (ANTIVERT) 25 MG tablet Take 1 tablet (25 mg total) by mouth 3 (three) times daily as needed for dizziness or nausea. 06/29/23  Yes Norlene Beavers, MD  ondansetron  (ZOFRAN -ODT) 4 MG disintegrating tablet Take 1 tablet (4 mg total) by mouth every 8 (eight) hours as needed for nausea or vomiting. 06/29/23  Yes Carrina Schoenberger J, MD  amLODipine (NORVASC) 2.5 MG tablet Take 2.5 mg by mouth daily. 08/25/19   [provider]  diclofenac  (VOLTAREN ) 75 MG EC tablet Take 1 tablet by mouth daily. 01/21/21   [provider]  lidocaine  (XYLOCAINE ) 2 % solution Use as directed 15 mLs in the mouth or throat every 3 (three) hours as needed for mouth pain (swish and spit). 10/26/20   Floydene Hy, PA-C  pantoprazole  (PROTONIX ) 20 MG tablet ok 06/20/19   [provider]  phenol (CHLORASEPTIC) 1.4 % LIQD Use as directed 1 spray in the mouth or throat as needed for throat irritation / pain. Patient not taking: Reported on 01/14/2023 02/24/21   Patel, Sona, MD  pravastatin  (PRAVACHOL ) 40 MG tablet Take 40 mg by mouth at bedtime. 06/20/19   [provider]  promethazine -dextromethorphan  (PROMETHAZINE -DM) 6.25-15 MG/5ML syrup Take 2.5 mLs by mouth 3 (three) times daily as needed for cough. 05/04/23   Zettie Hillock,  Jodi R, NP  venlafaxine  XR (EFFEXOR -XR) 75 MG 24 hr capsule Take by mouth. 06/20/19   [provider]  Vitamin D, Ergocalciferol, (DRISDOL) 1.25 MG (50000 UNIT) CAPS capsule Take 50,000 Units by mouth every 7 (seven) days.    [provider]     Allergies  Other and Statins   Family History   Family History  Problem Relation Age of Onset   Diabetes Brother      Physical Exam  Triage Vital Signs: ED  Triage Vitals  Encounter Vitals Group     BP 06/29/23 0050 (!) 156/76     Systolic BP Percentile --      Diastolic BP Percentile --      Pulse Rate 06/29/23 0050 90     Resp 06/29/23 0050 18     Temp 06/29/23 0050 98 F (36.7 C)     Temp Source 06/29/23 0050 Oral     SpO2 06/29/23 0043 96 %     Weight 06/29/23 0046 181 lb (82.1 kg)     Height 06/29/23 0046 5' (1.524 m)     Head Circumference --      Peak Flow --      Pain Score 06/29/23 0046 0     Pain Loc --      Pain Education --      Exclude from Growth Chart --     Updated Vital Signs: BP 135/81   Pulse 94   Temp 98 F (36.7 C) (Oral)   Resp 17   Ht 5' (1.524 m)   Wt 82.1 kg   SpO2 94%   BMI 35.35 kg/m    General: Awake, mild distress.  CV:  RRR.  Good peripheral perfusion.  Resp:  Normal effort.  CTAB. Abd:  Nontender.  No distention.  Other:  No carotid bruits.  Alert and oriented x 3.  CN II-XII grossly intact.  5/5 motor strength and sensation all extremities.  M AE x 4.   ED Results / Procedures / Treatments  Labs (all labs ordered are listed, but only abnormal results are displayed) Labs Reviewed  COMPREHENSIVE METABOLIC PANEL WITH GFR - Abnormal; Notable for the following components:      Result Value   CO2 21 (*)    Glucose, Bld 140 (*)    All other components within normal limits  CBC - Abnormal; Notable for the following components:   HCT 47.2 (*)    All other components within normal limits  URINALYSIS, ROUTINE W REFLEX MICROSCOPIC - Abnormal; Notable for the following components:   Color, Urine YELLOW (*)    APPearance HAZY (*)    Leukocytes,Ua TRACE (*)    Bacteria, UA RARE (*)    All other components within normal limits  TROPONIN I (HIGH SENSITIVITY)     EKG  ED ECG REPORT I, Rosaura Bolon J, the attending physician, personally viewed and interpreted this ECG.   Date: 06/29/2023  EKG Time: 0053  Rate: 90  Rhythm: normal sinus rhythm  Axis: Normal  Intervals:none  ST&T Change:  Nonspecific    RADIOLOGY I have independently visualized and interpreted patient's imaging study as well as noted the radiology interpretation:  CT head: No ICH  Official radiology report(s): CT Head Wo Contrast Result Date: 06/29/2023 CLINICAL DATA:  Dizziness.  Neuro deficit, acute, stroke suspected EXAM: CT HEAD WITHOUT CONTRAST TECHNIQUE: Contiguous axial images were obtained from the base of the skull through the vertex without intravenous contrast. RADIATION DOSE REDUCTION: This exam  was performed according to the departmental dose-optimization program which includes automated exposure control, adjustment of the mA and/or kV according to patient size and/or use of iterative reconstruction technique. COMPARISON:  None Available. FINDINGS: Brain: No acute intracranial abnormality. Specifically, no hemorrhage, hydrocephalus, mass lesion, acute infarction, or significant intracranial injury. Vascular: No hyperdense vessel or unexpected calcification. Skull: No acute calvarial abnormality. Sinuses/Orbits: No acute findings Other: None IMPRESSION: No acute intracranial abnormality. Electronically Signed   By: Janeece Mechanic M.D.   On: 06/29/2023 01:42     PROCEDURES:  Critical Care performed: No  .1-3 Lead EKG Interpretation  Performed by: Norlene Beavers, MD Authorized by: Norlene Beavers, MD     Interpretation: normal     ECG rate:  85   ECG rate assessment: normal     Rhythm: sinus rhythm     Ectopy: none     Conduction: normal   Comments:     Patient placed on cardiac monitor to evaluate for arrhythmias    MEDICATIONS ORDERED IN ED: Medications  fosfomycin (MONUROL) packet 3 g (has no administration in time range)  meclizine (ANTIVERT) tablet 25 mg (25 mg Oral Given 06/29/23 0427)  ondansetron  (ZOFRAN ) injection 4 mg (4 mg Intravenous Given 06/29/23 0427)     IMPRESSION / MDM / ASSESSMENT AND PLAN / ED COURSE  I reviewed the triage vital signs and the nursing notes.                              73 year old female presenting with dizziness.  Differential diagnosis includes but is not limited to CVA, ICH, BPV, infectious, metabolic etiologies, etc.  I personally reviewed patient's records and note a PCP office visit on 06/14/2023 for follow-up CHF, hypertension, DOE.  Patient's presentation is most consistent with acute complicated illness / injury requiring diagnostic workup.  The patient is on the cardiac monitor to evaluate for evidence of arrhythmia and/or significant heart rate changes.  Laboratory results unremarkable.  Troponin and EKG negative.  CT head negative for ICH.  No focal neurological deficits on examination.  Administer meclizine, Zofran .  Clinical Course as of 06/29/23 0553  Wed Jun 29, 2023  0551 Patient feeling better.  Troponin negative.  Fosfomycin will be given prior to discharge.  Patient will be discharged home on as needed Meclizine/Zofran .  She will follow-up closely with her PCP.  Strict return precautions given.  Patient and spouse verbalized understanding and agree with plan of care. [JS]    Clinical Course User Index [JS] Norlene Beavers, MD     FINAL CLINICAL IMPRESSION(S) / ED DIAGNOSES   Final diagnoses:  Dizziness  Lower urinary tract infectious disease     Rx / DC Orders   ED Discharge Orders          Ordered    meclizine (ANTIVERT) 25 MG tablet  3 times daily PRN        06/29/23 0552    ondansetron  (ZOFRAN -ODT) 4 MG disintegrating tablet  Every 8 hours PRN        06/29/23 8295             Note:  This document was prepared using Dragon voice recognition software and may include unintentional dictation errors.   Josclyn Rosales J, MD 06/29/23 [phone]

## 2024-03-05 ENCOUNTER — Ambulatory Visit
Admission: EM | Admit: 2024-03-05 | Discharge: 2024-03-05 | Disposition: A | Discharge status: Home / Self Care | Attending: Physician Assistant | Admitting: Physician Assistant

## 2024-03-05 DIAGNOSIS — B349 Viral infection, unspecified: Secondary | ICD-10-CM | POA: Diagnosis not present

## 2024-03-05 DIAGNOSIS — R0981 Nasal congestion: Secondary | ICD-10-CM | POA: Diagnosis not present

## 2024-03-05 DIAGNOSIS — R051 Acute cough: Secondary | ICD-10-CM

## 2024-03-05 LAB — POC COVID19/FLU A&B COMBO
Covid Antigen, POC: NEGATIVE
Influenza A Antigen, POC: NEGATIVE
Influenza B Antigen, POC: NEGATIVE

## 2024-03-05 MED ORDER — BENZONATATE 200 MG PO CAPS: 200.0000 mg | ORAL_CAPSULE | Freq: Three times a day (TID) | ORAL | 0 refills | Status: AC | PRN

## 2024-03-05 MED ORDER — PROMETHAZINE-DM 6.25-15 MG/5ML PO SYRP: 5.0000 mL | ORAL_SOLUTION | Freq: Four times a day (QID) | ORAL | 0 refills | Status: AC | PRN

## 2024-03-05 NOTE — ED Triage Notes (Signed)
 Patient presents to UC for body aches, cough, HA, and chest tightness since yesterday. Treating with mobic.

## 2024-03-05 NOTE — Discharge Instructions (Signed)
-  COVID/flu negative,   URI/COLD SYMPTOMS: Your exam today is consistent with a viral illness. Antibiotics are not indicated at this time. Use medications as directed, including cough syrup, nasal saline, and decongestants. Your symptoms should improve over the next few days and resolve within 7-10 days. Increase rest and fluids. F/u if symptoms worsen or predominate such as sore throat, ear pain, productive cough, shortness of breath, or if you develop high fevers or worsening fatigue over the next several days.

## 2024-03-05 NOTE — ED Provider Notes (Signed)
 " [facility]    CSN: [id number] Arrival date & time: 03/05/24  1437      History   Chief Complaint Chief Complaint  Patient presents with   Generalized Body Aches    HPI Kristen Riley is a 74 y.o. female presenting for fatigue, cough, congestion, chest tightness, headaches, mild sore throat, and body aches since yesterday. Denies fever, ear pain, sinus pain, wheezing, abdominal pain, vomiting or diarrhea.  Patient has been taking over-the-counter meds and Mobic.  History of baseline chest tightness and shortness of breath.  Current symptoms are no worse than baseline.  No other complaints.   HPI  Past Medical History:  Diagnosis Date   Arthritis    Depression    Depression    Dyspnea    Generalized anxiety disorder    GERD (gastroesophageal reflux disease)    HLD (hyperlipidemia)    Hypertension    PONV (postoperative nausea and vomiting)     Patient Active Problem List   Diagnosis Date Noted   SIRS (systemic inflammatory response syndrome) (HCC) 02/23/2021   Hypertension 02/23/2021   GERD (gastroesophageal reflux disease) 02/23/2021   HLD (hyperlipidemia) 02/23/2021   Sepsis (HCC) 02/23/2021   Acute bronchitis 02/23/2021   Adrenal nodule 02/23/2021   Streptococcal infection group A 02/23/2021   Streptococcal pharyngitis 02/23/2021    Past Surgical History:  Procedure Laterality Date   CATARACT EXTRACTION W/PHACO Left 01/19/2023   Procedure: CATARACT EXTRACTION PHACO AND INTRAOCULAR LENS PLACEMENT (IOC) LEFT;  Surgeon: Mittie Gaskin, MD;  Location: [facility];  Service: Ophthalmology;  Laterality: Left;  7.43 0:40.3   CATARACT EXTRACTION W/PHACO Right 01/26/2023   Procedure: CATARACT EXTRACTION PHACO AND INTRAOCULAR LENS PLACEMENT (IOC) RIGHT 8.17 00:48.7;  Surgeon: Mittie Gaskin, MD;  Location: [facility];  Service: Ophthalmology;  Laterality: Right;   CHOLECYSTECTOMY      OB History   No obstetric history on file.       Home Medications    Prior to Admission medications  Medication Sig Start Date End Date Taking? Authorizing Provider  benzonatate  (TESSALON ) 200 MG capsule Take 1 capsule (200 mg total) by mouth 3 (three) times daily as needed. 03/05/24  Yes Arvis Jolan NOVAK, PA-C  JARDIANCE 10 MG TABS tablet Take 10 mg by mouth. 02/16/24  Yes [provider]  meloxicam (MOBIC) 7.5 MG tablet Take 7.5 mg by mouth daily. 02/24/24 02/23/25 Yes [provider]  promethazine -dextromethorphan  (PROMETHAZINE -DM) 6.25-15 MG/5ML syrup Take 5 mLs by mouth 4 (four) times daily as needed. 03/05/24  Yes Arvis Jolan B, PA-C  amLODipine (NORVASC) 2.5 MG tablet Take 2.5 mg by mouth daily. 08/25/19   [provider]  diclofenac  (VOLTAREN ) 75 MG EC tablet Take 1 tablet by mouth daily. 01/21/21   [provider]  lidocaine  (XYLOCAINE ) 2 % solution Use as directed 15 mLs in the mouth or throat every 3 (three) hours as needed for mouth pain (swish and spit). 10/26/20   Arvis Jolan NOVAK, PA-C  meclizine  (ANTIVERT ) 25 MG tablet Take 1 tablet (25 mg total) by mouth 3 (three) times daily as needed for dizziness or nausea. 06/29/23   Sung, Jade J, MD  ondansetron  (ZOFRAN -ODT) 4 MG disintegrating tablet Take 1 tablet (4 mg total) by mouth every 8 (eight) hours as needed for nausea or vomiting. 06/29/23   Sung, Jade J, MD  pantoprazole  (PROTONIX ) 20 MG tablet ok 06/20/19   [provider]  phenol (CHLORASEPTIC) 1.4 % LIQD Use as directed 1 spray  in the mouth or throat as needed for throat irritation / pain. Patient not taking: Reported on 01/14/2023 02/24/21   Patel, Sona, MD  pravastatin  (PRAVACHOL ) 40 MG tablet Take 40 mg by mouth at bedtime. 06/20/19   [provider]  venlafaxine  XR (EFFEXOR -XR) 75 MG 24 hr capsule Take by mouth. 06/20/19   [provider]  Vitamin D, Ergocalciferol, (DRISDOL) 1.25 MG (50000 UNIT) CAPS capsule Take 50,000 Units by mouth every 7 (seven) days.     [provider]    Family History Family History  Problem Relation Age of Onset   Diabetes Brother     Social History Social History[1]   Allergies   Other and Statins   Review of Systems Review of Systems  Constitutional:  Positive for fatigue. Negative for chills, diaphoresis and fever.  HENT:  Positive for congestion, rhinorrhea and sore throat. Negative for ear pain, sinus pressure and sinus pain.   Respiratory:  Positive for cough, chest tightness and shortness of breath.   Cardiovascular:  Negative for chest pain.  Gastrointestinal:  Negative for abdominal pain, nausea and vomiting.  Musculoskeletal:  Positive for myalgias.  Skin:  Negative for rash.  Neurological:  Positive for headaches. Negative for weakness.  Hematological:  Negative for adenopathy.     Physical Exam Triage Vital Signs ED Triage Vitals  Encounter Vitals Group     BP      Girls Systolic BP Percentile      Girls Diastolic BP Percentile      Boys Systolic BP Percentile      Boys Diastolic BP Percentile      Pulse      Resp      Temp      Temp src      SpO2      Weight      Height      Head Circumference      Peak Flow      Pain Score      Pain Loc      Pain Education      Exclude from Growth Chart    No data found.  Updated Vital Signs BP (!) 152/85 (BP Location: Right Arm)   Pulse (!) 101   Temp 98.4 F (36.9 C) (Oral)   Resp 16   SpO2 95%    Physical Exam Vitals and nursing note reviewed.  Constitutional:      General: She is not in acute distress.    Appearance: Normal appearance. She is ill-appearing. She is not toxic-appearing.  HENT:     Head: Normocephalic and atraumatic.     Nose: Congestion present.     Mouth/Throat:     Mouth: Mucous membranes are moist.     Pharynx: Oropharynx is clear.  Eyes:     General: No scleral icterus.       Right eye: No discharge.        Left eye: No discharge.     Conjunctiva/sclera: Conjunctivae normal.   Cardiovascular:     Rate and Rhythm: Regular rhythm. Tachycardia present.     Heart sounds: Normal heart sounds.  Pulmonary:     Effort: Pulmonary effort is normal. No respiratory distress.     Breath sounds: Normal breath sounds.  Musculoskeletal:     Cervical back: Neck supple.  Skin:    General: Skin is dry.  Neurological:     General: No focal deficit present.     Mental Status: She is alert. Mental  status is at baseline.     Motor: No weakness.     Gait: Gait normal.  Psychiatric:        Mood and Affect: Mood normal.        Behavior: Behavior normal.      UC Treatments / Results  Labs (all labs ordered are listed, but only abnormal results are displayed) Labs Reviewed  POC COVID19/FLU A&B COMBO - Normal    EKG   Radiology No results found.  Procedures Procedures (including critical care time)  Medications Ordered in UC Medications - No data to display  Initial Impression / Assessment and Plan / UC Course  I have reviewed the triage vital signs and the nursing notes.  Pertinent labs & imaging results that were available during my care of the patient were reviewed by me and considered in my medical decision making (see chart for details).   74 year old female presents for onset of fatigue, cough, congestion, headaches, bodyaches and chest tightness yesterday.  Blood pressure elevated 166/108.  Pulse elevated 101 bpm.  Patient is ill-appearing but nontoxic.  On exam has nasal congestion.  Throat clear.  Chest clear.  Heart regular rhythm but mild tachycardic rate.  Rapid COVID/flu testing obtained.  Negative.  We discussed chest xray but patient is not reporting any increase in chest tightness and SOB from baseline and says she has been following up with PCP about this. No current chest pain and breathing normally.  Low suspicion for pneumonia, however we did discuss return and ER precautions.  Viral illness. Supportive care encouraged. Sent promethazine  DM  and benzonatate . Reviewed return precautions.    Final Clinical Impressions(s) / UC Diagnoses   Final diagnoses:  Acute cough  Viral illness  Nasal congestion     Discharge Instructions      -COVID/flu negative,   URI/COLD SYMPTOMS: Your exam today is consistent with a viral illness. Antibiotics are not indicated at this time. Use medications as directed, including cough syrup, nasal saline, and decongestants. Your symptoms should improve over the next few days and resolve within 7-10 days. Increase rest and fluids. F/u if symptoms worsen or predominate such as sore throat, ear pain, productive cough, shortness of breath, or if you develop high fevers or worsening fatigue over the next several days.       ED Prescriptions     Medication Sig Dispense Auth. Provider   promethazine -dextromethorphan  (PROMETHAZINE -DM) 6.25-15 MG/5ML syrup Take 5 mLs by mouth 4 (four) times daily as needed. 118 mL Arvis Huxley B, PA-C   benzonatate  (TESSALON ) 200 MG capsule Take 1 capsule (200 mg total) by mouth 3 (three) times daily as needed. 30 capsule Arvis Huxley NOVAK, PA-C      PDMP not reviewed this encounter.     [1]  Social History Tobacco Use   Smoking status: Never   Smokeless tobacco: Never  Vaping Use   Vaping status: Never Used  Substance Use Topics   Alcohol use: Not Currently   Drug use: Never     Arvis Huxley NOVAK DEVONNA 03/05/24 1535  "
